# Patient Record
Sex: Female | Born: 1950 | Race: Black or African American | Hispanic: No | Marital: Married | Smoking: Current every day smoker
Health system: Southern US, Community
[De-identification: ages and names within clinical notes are randomized; demographics above are authoritative.]

## PROBLEM LIST (undated history)

## (undated) DIAGNOSIS — E785 Hyperlipidemia, unspecified: Secondary | ICD-10-CM

## (undated) DIAGNOSIS — E079 Disorder of thyroid, unspecified: Secondary | ICD-10-CM

## (undated) DIAGNOSIS — K219 Gastro-esophageal reflux disease without esophagitis: Secondary | ICD-10-CM

## (undated) DIAGNOSIS — G5600 Carpal tunnel syndrome, unspecified upper limb: Secondary | ICD-10-CM

## (undated) DIAGNOSIS — M069 Rheumatoid arthritis, unspecified: Secondary | ICD-10-CM

## (undated) DIAGNOSIS — F419 Anxiety disorder, unspecified: Secondary | ICD-10-CM

## (undated) DIAGNOSIS — Z9889 Other specified postprocedural states: Secondary | ICD-10-CM

## (undated) DIAGNOSIS — I10 Essential (primary) hypertension: Secondary | ICD-10-CM

## (undated) DIAGNOSIS — K635 Polyp of colon: Secondary | ICD-10-CM

## (undated) DIAGNOSIS — F329 Major depressive disorder, single episode, unspecified: Secondary | ICD-10-CM

## (undated) DIAGNOSIS — F32A Depression, unspecified: Secondary | ICD-10-CM

## (undated) DIAGNOSIS — K760 Fatty (change of) liver, not elsewhere classified: Secondary | ICD-10-CM

## (undated) DIAGNOSIS — I2510 Atherosclerotic heart disease of native coronary artery without angina pectoris: Secondary | ICD-10-CM

## (undated) HISTORY — PX: HEMORRHOID SURGERY: SHX153

## (undated) HISTORY — DX: Hyperlipidemia, unspecified: E78.5

## (undated) HISTORY — DX: Anxiety disorder, unspecified: F41.9

## (undated) HISTORY — DX: Polyp of colon: K63.5

## (undated) HISTORY — DX: Gastro-esophageal reflux disease without esophagitis: K21.9

## (undated) HISTORY — PX: SKIN BIOPSY: SHX1

## (undated) HISTORY — DX: Fatty (change of) liver, not elsewhere classified: K76.0

## (undated) HISTORY — PX: ABDOMINAL HYSTERECTOMY: SHX81

## (undated) HISTORY — DX: Other specified postprocedural states: Z98.890

## (undated) HISTORY — DX: Major depressive disorder, single episode, unspecified: F32.9

## (undated) HISTORY — PX: BACK SURGERY: SHX140

## (undated) HISTORY — DX: Depression, unspecified: F32.A

## (undated) HISTORY — DX: Carpal tunnel syndrome, unspecified upper limb: G56.00

---

## 1978-09-14 HISTORY — PX: HERNIA REPAIR: SHX51

## 1983-09-15 HISTORY — PX: HEMORRHOID SURGERY: SHX153

## 2005-09-14 DIAGNOSIS — Z9889 Other specified postprocedural states: Secondary | ICD-10-CM

## 2005-09-14 HISTORY — DX: Other specified postprocedural states: Z98.890

## 2005-09-14 HISTORY — PX: CARPAL TUNNEL RELEASE: SHX101

## 2010-07-16 ENCOUNTER — Ambulatory Visit: Payer: Self-pay | Admitting: Radiology

## 2010-07-16 ENCOUNTER — Emergency Department (HOSPITAL_BASED_OUTPATIENT_CLINIC_OR_DEPARTMENT_OTHER)
Admission: EM | Admit: 2010-07-16 | Discharge: 2010-07-16 | Payer: Self-pay | Source: Home / Self Care | Admitting: Emergency Medicine

## 2010-08-14 LAB — HM PAP SMEAR

## 2011-06-15 LAB — HM COLONOSCOPY

## 2012-02-28 ENCOUNTER — Emergency Department (HOSPITAL_BASED_OUTPATIENT_CLINIC_OR_DEPARTMENT_OTHER): Payer: 59

## 2012-02-28 ENCOUNTER — Encounter (HOSPITAL_BASED_OUTPATIENT_CLINIC_OR_DEPARTMENT_OTHER): Payer: Self-pay | Admitting: *Deleted

## 2012-02-28 ENCOUNTER — Emergency Department (HOSPITAL_BASED_OUTPATIENT_CLINIC_OR_DEPARTMENT_OTHER)
Admission: EM | Admit: 2012-02-28 | Discharge: 2012-02-28 | Disposition: A | Discharge status: Home / Self Care | Payer: 59 | Attending: Emergency Medicine | Admitting: Emergency Medicine

## 2012-02-28 DIAGNOSIS — J020 Streptococcal pharyngitis: Secondary | ICD-10-CM | POA: Insufficient documentation

## 2012-02-28 DIAGNOSIS — M069 Rheumatoid arthritis, unspecified: Secondary | ICD-10-CM | POA: Insufficient documentation

## 2012-02-28 DIAGNOSIS — I10 Essential (primary) hypertension: Secondary | ICD-10-CM | POA: Insufficient documentation

## 2012-02-28 DIAGNOSIS — E079 Disorder of thyroid, unspecified: Secondary | ICD-10-CM | POA: Insufficient documentation

## 2012-02-28 DIAGNOSIS — I2510 Atherosclerotic heart disease of native coronary artery without angina pectoris: Secondary | ICD-10-CM | POA: Insufficient documentation

## 2012-02-28 DIAGNOSIS — Z79899 Other long term (current) drug therapy: Secondary | ICD-10-CM | POA: Insufficient documentation

## 2012-02-28 DIAGNOSIS — Z87891 Personal history of nicotine dependence: Secondary | ICD-10-CM | POA: Insufficient documentation

## 2012-02-28 DIAGNOSIS — E119 Type 2 diabetes mellitus without complications: Secondary | ICD-10-CM | POA: Insufficient documentation

## 2012-02-28 HISTORY — DX: Rheumatoid arthritis, unspecified: M06.9

## 2012-02-28 HISTORY — DX: Essential (primary) hypertension: I10

## 2012-02-28 HISTORY — DX: Disorder of thyroid, unspecified: E07.9

## 2012-02-28 HISTORY — DX: Atherosclerotic heart disease of native coronary artery without angina pectoris: I25.10

## 2012-02-28 MED ORDER — AMOXICILLIN 500 MG PO CAPS
500.0000 mg | ORAL_CAPSULE | Freq: Once | ORAL | Status: AC
  Administered 2012-02-28: 500 mg via ORAL

## 2012-02-28 MED ORDER — AMOXICILLIN 500 MG PO CAPS: 500.0000 mg | ORAL_CAPSULE | Freq: Two times a day (BID) | ORAL | Status: AC

## 2012-02-28 MED ORDER — IBUPROFEN 400 MG PO TABS
600.0000 mg | ORAL_TABLET | Freq: Once | ORAL | Status: AC
  Administered 2012-02-28: 600 mg via ORAL
  Filled 2012-02-28: qty 1

## 2012-02-28 MED ORDER — AMOXICILLIN 500 MG PO CAPS
ORAL_CAPSULE | ORAL | Status: AC
  Administered 2012-02-28: 500 mg via ORAL
  Filled 2012-02-28: qty 1

## 2012-02-28 MED ORDER — PENICILLIN G BENZATHINE 1200000 UNIT/2ML IM SUSP
1.2000 10*6.[IU] | Freq: Once | INTRAMUSCULAR | Status: DC
  Filled 2012-02-28: qty 2

## 2012-02-28 NOTE — Discharge Instructions (Signed)
You have strep throat. Treatment is antibiotics. You may be contagious for 24 hours after antibiotic. This seems to have made your rheumatoid arthritis worse.  You can take voltaren 75 mg twice daily for short term-next 2-4 days. You should follow up your joint pains with your primary doctor and rheumatoid doctor. If you have vomiting, shortness of breath, fevers persist or other concerning symptoms then return to the emergency room or call you doctor.  Strep Throat Strep throat is an infection of the throat. It is caused by a germ. Strep throat spreads from person to person by coughing, sneezing, or close contact. HOME CARE  Rinse your mouth (gargle) with warm salt water (1 teaspoon salt in 1 cup of water). Do this 3 to 4 times per day or as needed for comfort.   Family members with a sore throat or fever should see a doctor.   Make sure everyone in your house washes their hands well.   Do not share food, drinking cups, or personal items.   Eat soft foods until your sore throat gets better.   Drink enough water and fluids to keep your pee (urine) clear or pale yellow.   Rest.   Stay home from school, daycare, or work until you have taken medicine for 24 hours.   Only take medicine as told by your doctor.   Take your medicine as told. Finish it even if you start to feel better.  GET HELP RIGHT AWAY IF:   You have new problems, such as throwing up (vomiting) or bad headaches.   You have a stiff or painful neck, chest pain, trouble breathing, or trouble swallowing.   You have very bad throat pain, drooling, or changes in your voice.   Your neck puffs up (swells) or gets red and tender.   You have a fever.   You are very tired, your mouth is dry, or you are peeing less than normal.   You cannot wake up completely.   You get a rash, cough, or earache.   You have green, yellow-brown, or bloody spit.   Your pain does not get better with medicine.  MAKE SURE YOU:    Understand these instructions.   Will watch your condition.   Will get help right away if you are not doing well or get worse.  Document Released: 02/17/2008 Document Revised: 08/20/2011 Document Reviewed: 10/30/2010 [facility] Patient Information 2012 [place], [place].

## 2012-02-28 NOTE — ED Notes (Signed)
Pt has hx of RA and her body pain comes and goes. This episode since Friday. ST, Chills onset yesterday

## 2012-02-28 NOTE — ED Provider Notes (Signed)
I saw and evaluated the patient, reviewed the resident's note and I agree with the findings and plan.   .Face to face Exam:  General:  Awake HEENT:  Atraumatic Resp:  Normal effort Abd:  Nondistended Neuro:No focal weakness Lymph: No adenopathy   Nelia Shi, MD 02/28/12 2138

## 2012-02-28 NOTE — ED Provider Notes (Signed)
History     CSN: [id number]  Arrival date & time 02/28/12  1918   First MD Initiated Contact with Patient 02/28/12 1954      Chief Complaint  Patient presents with  . Sore Throat    (Consider location/radiation/quality/duration/timing/severity/associated sxs/prior treatment) HPI Comments: 61 yo female with RA, DM presents with worsening of usual aches and pains, and new onset sore throat for 2 days, fever and chills at home. Has taken only her plaquenil today for pain. Her pains are usually in her right hip, low back and left elbow. Has odynophagia and not eating solids since yesterday. She denies any dyspnea, cough, chest pain, congestion, runny nose, rash, leg swelling, abdominal pain, emesis, poor appetite, change in urine.   Patient is a 61 y.o. female presenting with pharyngitis.  Sore Throat Associated symptoms include chills. Pertinent negatives include no chest pain, congestion, coughing, diaphoresis, headaches, joint swelling, neck pain or rash.    Past Medical History  Diagnosis Date  . Rheumatoid arthritis   . Hypertension   . Diabetes mellitus   . Thyroid disease   . Coronary artery disease     Past Surgical History  Procedure Date  . Abdominal hysterectomy   . Hemorrhoid surgery   . Skin biopsy   . Back surgery     No family history on file.  History  Substance Use Topics  . Smoking status: Former Games developer  . Smokeless tobacco: Not on file  . Alcohol Use: Yes    OB History    Grav Para Term Preterm Abortions TAB SAB Ect Mult Living                  Review of Systems  Constitutional: Positive for chills. Negative for diaphoresis.  HENT: Negative for ear pain, nosebleeds, congestion, rhinorrhea, neck pain and postnasal drip.   Respiratory: Negative for cough, choking, shortness of breath and wheezing.   Cardiovascular: Negative for chest pain and leg swelling.  Genitourinary: Negative for decreased urine volume and difficulty urinating.    Musculoskeletal: Negative for joint swelling.  Skin: Negative for rash.  Neurological: Positive for dizziness. Negative for headaches.  All other systems reviewed and are negative.    Allergies  Review of patient's allergies indicates no known allergies.  Home Medications   Current Outpatient Rx  Name Route Sig Dispense Refill  . ALPRAZOLAM 1 MG PO TABS Oral Take 0.5-1 mg by mouth 2 (two) times daily as needed. For anxiety or sleep    . AMLODIPINE BESYLATE 10 MG PO TABS Oral Take 10 mg by mouth daily.    Marland Kitchen DICLOFENAC SODIUM 75 MG PO TBEC Oral Take 75 mg by mouth at bedtime.    Marland Kitchen EZETIMIBE 10 MG PO TABS Oral Take 10 mg by mouth daily.    Marland Kitchen HYDROXYCHLOROQUINE SULFATE 200 MG PO TABS Oral Take 200 mg by mouth 2 (two) times daily.    Marland Kitchen LEVOTHYROXINE SODIUM PO Oral Take by mouth.    Marland Kitchen LIDOCAINE 5 % EX PTCH Transdermal Place 1 patch onto the skin daily. Remove & Discard patch within 12 hours or as directed by MD    . METFORMIN HCL ER 500 MG PO TB24 Oral Take 1,500 mg by mouth at bedtime.    Marland Kitchen NAPROXEN SODIUM 220 MG PO TABS Oral Take 440 mg by mouth 2 (two) times daily as needed. For pain    . PANTOPRAZOLE SODIUM 40 MG PO TBEC Oral Take 40 mg by mouth 2 (two) times daily.     Marland Kitchen  PSEUDOEPHEDRINE-GUAIFENESIN ER 60-600 MG PO TB12 Oral Take 1 tablet by mouth every 12 (twelve) hours. For cough    . VALSARTAN-HYDROCHLOROTHIAZIDE 320-25 MG PO TABS Oral Take 1 tablet by mouth daily.      BP 160/89  Pulse 92  Temp 99.7 F (37.6 C) (Oral)  Resp 20  Ht 5\' 5"  (1.651 m)  Wt 194 lb (87.998 kg)  BMI 32.28 kg/m2  SpO2 98%  Physical Exam  Vitals reviewed. Constitutional: She is oriented to person, place, and time. She appears well-developed and well-nourished. No distress.  HENT:  Head: Normocephalic and atraumatic.  Right Ear: External ear normal.  Left Ear: External ear normal.       Moderate erythema posterior pharynx. No exudates.  Bilateral cervical LAD.  Eyes: EOM are normal. Pupils are  equal, round, and reactive to light.  Neck: Neck supple.  Cardiovascular: Normal rate, regular rhythm and normal heart sounds.   Pulmonary/Chest: Effort normal and breath sounds normal. No respiratory distress. She has no wheezes. She has no rales.  Abdominal: Soft.  Musculoskeletal: She exhibits no edema and no tenderness.  Lymphadenopathy:    She has cervical adenopathy.  Neurological: She is alert and oriented to person, place, and time. No cranial nerve deficit. Coordination normal.  Skin: No rash noted. She is not diaphoretic.  Psychiatric: She has a normal mood and affect.    ED Course  Procedures (including critical care time)  Labs Reviewed  RAPID STREP SCREEN - Abnormal; Notable for the following:    Streptococcus, Group A Screen (Direct) POSITIVE (*)     All other components within normal limits   No results found.   1. Strep pharyngitis   2. Rheumatoid arthritis       MDM  61 yo female with RA presents with worsening of baseline joint pains, 2 days of sore throat, low grade fever, positive rapid strep. Will treat with pcn IM x1 and advise to use her home NSAID scheduled for next several days. Patient to see her rheumatologist this month for follow up.  Follow up for worsening fevers, dyspnea, joint swelling.       Durwin Reges, MD 02/28/12 2128

## 2012-11-10 ENCOUNTER — Encounter: Payer: Self-pay | Admitting: Family Medicine

## 2012-11-10 DIAGNOSIS — F32A Depression, unspecified: Secondary | ICD-10-CM | POA: Insufficient documentation

## 2012-11-10 DIAGNOSIS — F419 Anxiety disorder, unspecified: Secondary | ICD-10-CM | POA: Insufficient documentation

## 2012-11-10 DIAGNOSIS — E785 Hyperlipidemia, unspecified: Secondary | ICD-10-CM | POA: Insufficient documentation

## 2012-11-10 DIAGNOSIS — K635 Polyp of colon: Secondary | ICD-10-CM | POA: Insufficient documentation

## 2012-11-10 DIAGNOSIS — K219 Gastro-esophageal reflux disease without esophagitis: Secondary | ICD-10-CM | POA: Insufficient documentation

## 2012-11-10 DIAGNOSIS — K760 Fatty (change of) liver, not elsewhere classified: Secondary | ICD-10-CM | POA: Insufficient documentation

## 2012-12-09 ENCOUNTER — Other Ambulatory Visit: Payer: Self-pay | Admitting: Family Medicine

## 2012-12-09 ENCOUNTER — Other Ambulatory Visit (INDEPENDENT_AMBULATORY_CARE_PROVIDER_SITE_OTHER): Payer: 59 | Admitting: Family Medicine

## 2012-12-09 DIAGNOSIS — R5381 Other malaise: Secondary | ICD-10-CM

## 2012-12-09 DIAGNOSIS — E782 Mixed hyperlipidemia: Secondary | ICD-10-CM

## 2012-12-09 DIAGNOSIS — M79609 Pain in unspecified limb: Secondary | ICD-10-CM

## 2012-12-09 DIAGNOSIS — R7301 Impaired fasting glucose: Secondary | ICD-10-CM

## 2012-12-09 DIAGNOSIS — E559 Vitamin D deficiency, unspecified: Secondary | ICD-10-CM

## 2012-12-09 DIAGNOSIS — I10 Essential (primary) hypertension: Secondary | ICD-10-CM

## 2012-12-10 LAB — CBC WITH DIFFERENTIAL/PLATELET
Basophils Absolute: 0 10*3/uL (ref 0.0–0.1)
Basophils Relative: 0 % (ref 0–1)
Eosinophils Absolute: 0.1 10*3/uL (ref 0.0–0.7)
Eosinophils Relative: 1 % (ref 0–5)
HCT: 38.7 % (ref 36.0–46.0)
Hemoglobin: 13 g/dL (ref 12.0–15.0)
Lymphocytes Relative: 42 % (ref 12–46)
Lymphs Abs: 3.9 10*3/uL (ref 0.7–4.0)
MCH: 28.2 pg (ref 26.0–34.0)
MCHC: 33.6 g/dL (ref 30.0–36.0)
MCV: 83.9 fL (ref 78.0–100.0)
Monocytes Absolute: 0.5 10*3/uL (ref 0.1–1.0)
Monocytes Relative: 6 % (ref 3–12)
Neutro Abs: 4.7 10*3/uL (ref 1.7–7.7)
Neutrophils Relative %: 51 % (ref 43–77)
Platelets: 309 10*3/uL (ref 150–400)
RBC: 4.61 MIL/uL (ref 3.87–5.11)
RDW: 14.5 % (ref 11.5–15.5)
WBC: 9.3 10*3/uL (ref 4.0–10.5)

## 2012-12-10 LAB — LIPID PANEL
Cholesterol: 231 mg/dL — ABNORMAL HIGH (ref 0–200)
HDL: 50 mg/dL (ref >39)
LDL Cholesterol: 160 mg/dL — ABNORMAL HIGH (ref 0–99)
Total CHOL/HDL Ratio: 4.6 Ratio
Triglycerides: 103 mg/dL (ref <150)
VLDL: 21 mg/dL (ref 0–40)

## 2012-12-10 LAB — COMPLETE METABOLIC PANEL WITH GFR
ALT: 43 U/L — ABNORMAL HIGH (ref 0–35)
AST: 29 U/L (ref 0–37)
Albumin: 4.4 g/dL (ref 3.5–5.2)
Alkaline Phosphatase: 79 U/L (ref 39–117)
BUN: 20 mg/dL (ref 6–23)
CO2: 27 mEq/L (ref 19–32)
Calcium: 10 mg/dL (ref 8.4–10.5)
Chloride: 101 mEq/L (ref 96–112)
Creat: 0.99 mg/dL (ref 0.50–1.10)
GFR, Est African American: 71 mL/min
GFR, Est Non African American: 62 mL/min
Glucose, Bld: 90 mg/dL (ref 70–99)
Potassium: 3.6 mEq/L (ref 3.5–5.3)
Sodium: 138 mEq/L (ref 135–145)
Total Bilirubin: 0.5 mg/dL (ref 0.3–1.2)
Total Protein: 7.3 g/dL (ref 6.0–8.3)

## 2012-12-10 LAB — VITAMIN D 25 HYDROXY (VIT D DEFICIENCY, FRACTURES): Vit D, 25-Hydroxy: 15 ng/mL — ABNORMAL LOW (ref 30–89)

## 2012-12-10 LAB — HEMOGLOBIN A1C
Hgb A1c MFr Bld: 6.3 % — ABNORMAL HIGH (ref <5.7)
Mean Plasma Glucose: 134 mg/dL — ABNORMAL HIGH (ref <117)

## 2012-12-10 LAB — URIC ACID: Uric Acid, Serum: 6.2 mg/dL (ref 2.4–7.0)

## 2012-12-12 LAB — CK: Total CK: 318 U/L — ABNORMAL HIGH (ref 7–177)

## 2012-12-15 ENCOUNTER — Ambulatory Visit (INDEPENDENT_AMBULATORY_CARE_PROVIDER_SITE_OTHER): Payer: 59 | Admitting: Family Medicine

## 2012-12-15 ENCOUNTER — Encounter: Payer: Self-pay | Admitting: Family Medicine

## 2012-12-15 VITALS — BP 147/83 | HR 79 | Wt 204.0 lb

## 2012-12-15 DIAGNOSIS — R748 Abnormal levels of other serum enzymes: Secondary | ICD-10-CM

## 2012-12-15 DIAGNOSIS — E669 Obesity, unspecified: Secondary | ICD-10-CM

## 2012-12-15 DIAGNOSIS — I10 Essential (primary) hypertension: Secondary | ICD-10-CM | POA: Insufficient documentation

## 2012-12-15 DIAGNOSIS — R7401 Elevation of levels of liver transaminase levels: Secondary | ICD-10-CM

## 2012-12-15 DIAGNOSIS — E785 Hyperlipidemia, unspecified: Secondary | ICD-10-CM

## 2012-12-15 DIAGNOSIS — IMO0001 Reserved for inherently not codable concepts without codable children: Secondary | ICD-10-CM

## 2012-12-15 DIAGNOSIS — M109 Gout, unspecified: Secondary | ICD-10-CM

## 2012-12-15 DIAGNOSIS — E119 Type 2 diabetes mellitus without complications: Secondary | ICD-10-CM

## 2012-12-15 DIAGNOSIS — Z01818 Encounter for other preprocedural examination: Secondary | ICD-10-CM

## 2012-12-15 MED ORDER — CANAGLIFLOZIN 300 MG PO TABS: 300.0000 mg | ORAL_TABLET | Freq: Every day | ORAL | Status: AC

## 2012-12-15 MED ORDER — ALLOPURINOL 300 MG PO TABS: 300.0000 mg | ORAL_TABLET | Freq: Every day | ORAL | Status: AC

## 2012-12-15 MED ORDER — DULOXETINE HCL 60 MG PO CPEP: 60.0000 mg | ORAL_CAPSULE | Freq: Every day | ORAL | Status: AC

## 2012-12-15 MED ORDER — ROSUVASTATIN CALCIUM 10 MG PO TABS: 10.0000 mg | ORAL_TABLET | Freq: Every day | ORAL | Status: AC

## 2012-12-15 NOTE — Patient Instructions (Addendum)
1)  Gout - Your uric acid level is down to 6.2 with 200 mg of Allopurinol.  Let's increase your dosage to 300 mg to get your level to <6.  You will need to go back on the Colcrys 1 per day for the next 2 weeks as you increase to 300 mg.  After you have been on the 300 mg for 2 weeks then stop the Colcrys.    2)  Cholesterol/CAD - Start on Crestor 5 mg per day (1/2 of the 10).  You also need to be on a Baby ASA - 81 mg after your surgery.  We are going to recheck your cholesterol and muscle enzyme in 3 months.  If ... you develop substantial increased muscle pain, come in before so we can just check the muscle enz  3)  Diabetes - Stay on the Metformin 3 per day and add some Invokana.  You will start with 100 mg for 2 weeks and then increase to 300 mg.  We'll recheck your A1c in 3 months.  4)  Vitamin D - You might feel a lot better if you could get your Vitamin D level to 55.  You need to add Vitamin D 3 - 5,000 - 10,000 per day.  We'll recheck this number in 3 months.    5)  Elevated Liver Enzymes - Your ALT continues to be mildly elevated.  If you would do a low carb diet like [place], Sugar Busters or HCG and take Vitamin E 400 IU daily then I am sure your level enzymes would be normal and you would have less of a fatty liver.      Fatty Liver Fatty liver is the accumulation of fat in liver cells. It is also called hepatosteatosis or steatohepatitis. It is normal for your liver to contain some fat. If fat is more than 5 to 10% of your liver's weight, you have fatty liver.  There are often no symptoms (problems) for years while damage is still occurring. People often learn about their fatty liver when they have medical tests for other reasons. Fat can damage your liver for years or even decades without causing problems. When it becomes severe, it can cause fatigue, weight loss, weakness, and confusion. This makes you more likely to develop more serious liver problems. The liver is the largest  organ in the body. It does a lot of work and often gives no warning signs when it is sick until late in a disease. The liver has many important jobs including:  Breaking down foods.  Storing vitamins, iron, and other minerals.  Making proteins.  Making bile for food digestion.  Breaking down many products including medications, alcohol and some poisons. CAUSES  There are a number of different conditions, medications, and poisons that can cause a fatty liver. Eating too many calories causes fat to build up in the liver. Not processing and breaking fats down normally may also cause this. Certain conditions, such as obesity, diabetes, and high triglycerides also cause this. Most fatty liver patients tend to be middle-aged and over weight.  Some causes of fatty liver are:  Alcohol over consumption.  Malnutrition.  Steroid use.  Valproic acid toxicity.  Obesity.  Cushing's syndrome.  Poisons.  Tetracycline in high dosages.  Pregnancy.  Diabetes.  Hyperlipidemia.  Rapid weight loss. Some people develop fatty liver even having none of these conditions. SYMPTOMS  Fatty liver most often causes no problems. This is called asymptomatic.  It can be  diagnosed with blood tests and also by a liver biopsy.  It is one of the most common causes of minor elevations of liver enzymes on routine blood tests.  Specialized Imaging of the liver using ultrasound, CT (computed tomography) scan, or MRI (magnetic resonance imaging) can suggest a fatty liver but a biopsy is needed to confirm it.  A biopsy involves taking a small sample of liver tissue. This is done by using a needle. It is then looked at under a microscope by a specialist. TREATMENT  It is important to treat the cause. Simple fatty liver without a medical reason may not need treatment.  Weight loss, fat restriction, and exercise in overweight patients produces inconsistent results but is worth trying.  Fatty liver due to  alcohol toxicity may not improve even with stopping drinking.  Good control of diabetes may reduce fatty liver.  Lower your triglycerides through diet, medication or both.  Eat a balanced, healthy diet.  Increase your physical activity.  Get regular checkups from a liver specialist.  There are no medical or surgical treatments for a fatty liver or NASH, but improving your diet and increasing your exercise may help prevent or reverse some of the damage. PROGNOSIS  Fatty liver may cause no damage or it can lead to an inflammation of the liver. This is, called steatohepatitis. When it is linked to alcohol abuse, it is called alcoholic steatohepatitis. It often is not linked to alcohol. It is then called nonalcoholic steatohepatitis, or NASH. Over time the liver may become scarred and hardened. This condition is called cirrhosis. Cirrhosis is serious and may lead to liver failure or cancer. NASH is one of the leading causes of cirrhosis. About 10-20% of Americans have fatty liver and a smaller 2-5% has NASH. Document Released: 10/16/2005 Document Revised: 11/23/2011 Document Reviewed: 12/09/2005 [facility] Patient Information 2013 [place], [place].

## 2012-12-15 NOTE — Progress Notes (Addendum)
Subjective:     Patient ID: Brianna Long, female   DOB: April 08, 1951, 62 y.o.   MRN: [id number]  HPI Brianna Long is here today to go over her most recent lab results and to discuss the difficulty she has had getting cleared for back surgery.  She was seen by Dr. Tollie Pizza with [facility].  She was under the impression that he cleared her for surgery so she does not understand why she has not been able to proceed with her surgery.  She also needs to have several of her medications refilled.    1)  Hyperlipidemia - Dr. Reuel Boom is concerned that she is not on a statin. She was on one back in 2012 but it was stopped because of her pain and because her CPK was elevated. She has been taking a combination of Lovaza and Zetia.    2)  Gout - She has been taking 100 mg of Allopurinol.  3)  Blood Pressure - She is taking her Diovan HCT in the am and Norvasc at night.    4)  Mood/Chronic Pain - She is doing fine on the 30 mg of Cymbalta.    5)  Diabetes - She is doing fine on 1000 mg of Metformin daily.      Review of Systems  Constitutional: Positive for activity change (She is limited by pain ) and fatigue. Negative for unexpected weight change.  Respiratory: Negative for shortness of breath.   Cardiovascular: Negative for chest pain, palpitations and leg swelling.  Gastrointestinal: Negative for diarrhea.  Endocrine: Negative for polydipsia and polyuria.  Genitourinary: Negative for urgency and frequency.  Musculoskeletal: Positive for myalgias, back pain, joint swelling and arthralgias.  Skin: Negative for rash.  Neurological: Positive for weakness and numbness.  Psychiatric/Behavioral: Positive for agitation (She is annoyed that she is having to jump through so many hoops to get her surgery.).       Objective:   Physical Exam  Constitutional: She is oriented to person, place, and time. She appears well-nourished. No distress.  HENT:  Head: Normocephalic.  Eyes: Conjunctivae and  EOM are normal. No scleral icterus.  Neck: Neck supple. No thyromegaly present.  Cardiovascular: Normal rate, regular rhythm and normal heart sounds.   Pulmonary/Chest: Effort normal and breath sounds normal.  Abdominal: Soft. She exhibits no distension and no mass. There is no tenderness.  Musculoskeletal: She exhibits tenderness.  Neurological: She is alert and oriented to person, place, and time.  Skin: Skin is warm and dry. No rash noted.  Psychiatric: She has a normal mood and affect. Her behavior is normal. Judgment and thought content normal.  She is frustrated and wants to feel better.         Assessment:     1)  Hyperlipidemia - Her LDL is elevated at 160.  Her cardiologist is concerned about her not being on a statin given her history of CAD.  She was on Lipitor until 4/12.  At that time, she was complaining of a lot of generalized muscle pain.  A CPK level was checked and it was noted to be >400 so the Lipitor was stopped and she was put on Zeta.  She was on Zetia for a while but then stopped taking it because of the cost.    2)  Gout - Her uric level has improved.    3)  Hypertension - Her BP was elevated today.  She used to take Exforge HCT and her BP was controlled. She is  currently taking Diovan HCT in the am and Norvasc at night.   4)  Chronic Pain - She is taking 60 mg of Cymbalta.    5)  Type II DM - Her A1c is 6.3%.    6)  Elevated Transaminase - Her ALT remains slightly elevated. She has been told in the past she has a fatty liver.    7)  Vitamin D Deficiency - Her Vitamin D is very low.     8)  Obesity   9)  Surgical Clearance -  Dr. Rosann Auerbach preoperative assessment was that she is at low risk for having a  perioperative cardiac event and he did not feel that she needed a stress test prior to surgery. The note did however go on to state that given her history of CAD, she is at a "high risk for future CAD given her risk factors.        Plan:     1)   Jeannetta's Lipitor was stopped back in 2012 because of her pain and the elevation of CPK. We rechecked her level last week and while it is 100 points lower than in 2012 on Lipitor, it is still elevated.  Since there is concern of her having surgery off of a statin, we'll get her on a low dosage of Crestor.  She is return for a recheck of her CPK if she develops increased pain.  She has not been taking Zetia due to her cost.  We'll recheck her lipid panel in 3 months.    2)  She is to get back on Colcrys and increase her allopurinol to 300 mg then stop the Colcrys after 2 weeks.    3) Recommended that she take her Diovan HCT and Norasc together in the am.    4)  Refilled her Cymbalta.    5)  Adding some Invokana to help get her A1c <6 and help with weight loss.   6)  She was encouraged to follow a low carb diet like [place] vs Sugar Busters vs the HCG diet to help both her pain and her fatty liver. She is to take some Vitamin E.    7)  We have discussed before the importance of Vitamin D and that it might help her pain.  She is to take 5,000 - 10,000 IU of Vitamin D3.    8)  Hopefully, she will be able to exercise once her back feels better.

## 2012-12-20 ENCOUNTER — Ambulatory Visit: Payer: 59 | Admitting: Family Medicine

## 2012-12-26 ENCOUNTER — Ambulatory Visit: Payer: 59 | Admitting: Family Medicine

## 2012-12-27 ENCOUNTER — Encounter: Payer: Self-pay | Admitting: Family Medicine

## 2013-04-14 ENCOUNTER — Encounter: Payer: Self-pay | Admitting: *Deleted

## 2013-09-02 ENCOUNTER — Other Ambulatory Visit: Payer: Self-pay | Admitting: Family Medicine

## 2013-12-01 ENCOUNTER — Other Ambulatory Visit: Payer: Self-pay | Admitting: Family Medicine

## 2016-05-24 ENCOUNTER — Emergency Department (HOSPITAL_BASED_OUTPATIENT_CLINIC_OR_DEPARTMENT_OTHER)
Admission: EM | Admit: 2016-05-24 | Discharge: 2016-05-25 | Disposition: A | Discharge status: Home / Self Care | Payer: Medicare Other | Attending: Emergency Medicine | Admitting: Emergency Medicine

## 2016-05-24 ENCOUNTER — Encounter (HOSPITAL_BASED_OUTPATIENT_CLINIC_OR_DEPARTMENT_OTHER): Payer: Self-pay | Admitting: Emergency Medicine

## 2016-05-24 DIAGNOSIS — Z7984 Long term (current) use of oral hypoglycemic drugs: Secondary | ICD-10-CM | POA: Insufficient documentation

## 2016-05-24 DIAGNOSIS — F17210 Nicotine dependence, cigarettes, uncomplicated: Secondary | ICD-10-CM | POA: Diagnosis not present

## 2016-05-24 DIAGNOSIS — I2510 Atherosclerotic heart disease of native coronary artery without angina pectoris: Secondary | ICD-10-CM | POA: Insufficient documentation

## 2016-05-24 DIAGNOSIS — R51 Headache: Secondary | ICD-10-CM | POA: Insufficient documentation

## 2016-05-24 DIAGNOSIS — E119 Type 2 diabetes mellitus without complications: Secondary | ICD-10-CM | POA: Diagnosis not present

## 2016-05-24 DIAGNOSIS — I10 Essential (primary) hypertension: Secondary | ICD-10-CM | POA: Diagnosis not present

## 2016-05-24 DIAGNOSIS — R519 Headache, unspecified: Secondary | ICD-10-CM

## 2016-05-24 DIAGNOSIS — Z79899 Other long term (current) drug therapy: Secondary | ICD-10-CM | POA: Insufficient documentation

## 2016-05-24 DIAGNOSIS — H5713 Ocular pain, bilateral: Secondary | ICD-10-CM | POA: Diagnosis present

## 2016-05-24 MED ORDER — KETOROLAC TROMETHAMINE 15 MG/ML IJ SOLN
15.0000 mg | Freq: Once | INTRAMUSCULAR | Status: AC
  Administered 2016-05-24: 15 mg via INTRAVENOUS
  Filled 2016-05-24: qty 1

## 2016-05-24 NOTE — ED Provider Notes (Addendum)
[facility] Provider Note: Brianna DellJ. Lane Braedin Millhouse, MD, FACEP  CSN: [id number] MRN: [id number] ARRIVAL: 05/24/16 at 2138 By signing my name below, I, Brianna Long, attest that this documentation has been prepared under the direction and in the presence of Brianna LibraJohn Loraina Stauffer, MD.  Electronically Signed: Octavia HeirArianna Long, ED Scribe. 05/24/16. 11:30 PM.   CHIEF COMPLAINT  Eye Pain (bilateral)   HISTORY OF PRESENT ILLNESS  Brianna Long is a 65 y.o. female who presents to the Emergency Department with bilateral eye and forehead pain onset this morning. Pt reports she woke up this morning with bilateral eye redness and says it feels like a pressure-like feeling in her eyes. She notes having an associated sharp headache in the forehead and mild nausea. She has taken tramadol and mucinex to alleviate her symptoms with partial relief. She has not taken Tylenol or NSAID. Pt was seen by her opthalmologist last week where she was diagnosed with dry eyes and was started on Restasis and a steroid. She has run out of her Restasis samples but has a prescription she has yet to fill. Pt denies visual disturbance or photophobia. She has had some nasal congestion.  Past Medical History:  Diagnosis Date  . Anxiety   . Carpal tunnel syndrome   . Colon polyps   . Coronary artery disease   . Coronary artery disease    Dr Jeralene HuffKurt Daniels  . Depression   . Diabetes mellitus   . Fatty liver   . GERD (gastroesophageal reflux disease)   . H/O carpal tunnel repair 2007  . Hx of biopsy    Left Breast  . Hyperlipidemia   . Hypertension   . Hypertension   . Rheumatoid arthritis(714.0)   . Thyroid disease     Past Surgical History:  Procedure Laterality Date  . ABDOMINAL HYSTERECTOMY    . BACK SURGERY    . CARPAL TUNNEL RELEASE  2007  . HEMORRHOID SURGERY    . HEMORRHOID SURGERY  1985  . HERNIA REPAIR  1980  . SKIN BIOPSY      Family History  Problem Relation Age of Onset  . Hypertension Mother   .  Hyperlipidemia Mother   . Depression Mother   . Thyroid disease Mother   . Hypertension Father   . Breast cancer Paternal Aunt     Social History  Substance Use Topics  . Smoking status: Current Every Day Smoker    Packs/day: 0.50    Years: 0.00    Types: Cigarettes    Last attempt to quit: 07/29/2009  . Smokeless tobacco: Never Used  . Alcohol use No    Prior to Admission medications   Medication Sig Start Date End Date Taking? Authorizing Provider  TRAMADOL HCL ER PO Take by mouth.   Yes Historical Provider, MD  allopurinol (ZYLOPRIM) 100 MG tablet Take 100 mg by mouth 2 (two) times daily.    Historical Provider, MD  allopurinol (ZYLOPRIM) 300 MG tablet Take 1 tablet (300 mg total) by mouth daily. 12/15/12 12/15/13  Gillian Scarceobyn K Zanard, MD  ALPRAZolam Prudy Feeler(XANAX) 1 MG tablet Take 0.5-1 mg by mouth 2 (two) times daily as needed. For anxiety or sleep    Historical Provider, MD  amLODipine (NORVASC) 10 MG tablet Take 10 mg by mouth daily.    Historical Provider, MD  Canagliflozin (INVOKANA) 300 MG TABS Take 300 mg by mouth daily. 12/15/12   Gillian Scarceobyn K Zanard, MD  COLCRYS 0.6 MG tablet  11/13/12   Historical Provider, MD  DULoxetine (CYMBALTA) 60 MG capsule Take 1 capsule (60 mg total) by mouth daily. 12/15/12   Gillian Scarce, MD  levothyroxine (SYNTHROID, LEVOTHROID) 100 MCG tablet Take 100 mcg by mouth daily.    Historical Provider, MD  metFORMIN (GLUCOPHAGE-XR) 500 MG 24 hr tablet Take 1,000 mg by mouth at bedtime.     Historical Provider, MD  omega-3 acid ethyl esters (LOVAZA) 1 G capsule Take 2 g by mouth 2 (two) times daily.    Historical Provider, MD  pantoprazole (PROTONIX) 40 MG tablet Take 40 mg by mouth 2 (two) times daily.     Historical Provider, MD  promethazine (PHENERGAN) 25 MG tablet Take 12.5 mg by mouth every 6 (six) hours as needed for nausea.    Historical Provider, MD  rosuvastatin (CRESTOR) 10 MG tablet Take 1 tablet (10 mg total) by mouth daily. 12/15/12   Gillian Scarce, MD    valsartan-hydrochlorothiazide (DIOVAN-HCT) 320-25 MG per tablet Take 1 tablet by mouth daily.    Historical Provider, MD    Allergies Review of patient's allergies indicates no known allergies.   REVIEW OF SYSTEMS  Negative except as noted here or in the History of Present Illness.   PHYSICAL EXAMINATION  Initial Vital Signs Blood pressure 159/73, pulse (!) 54, temperature 98.2 F (36.8 C), temperature source Oral, resp. rate 18, height 5\' 5"  (1.651 m), weight 192 lb (87.1 kg), SpO2 99 %.  Examination General: Well-developed, well-nourished female in no acute distress; appearance consistent with age of record HENT: normocephalic; atraumatic; mild tenderness to percussion over frontal sinuses Eyes: pupils equal, round and reactive to light; extraocular muscles intact, mild conjunctival injection bilaterally Neck: supple Heart: regular rate and rhythm Lungs: clear to auscultation bilaterally Abdomen: soft; nondistended; nontender; no masses or hepatosplenomegaly; bowel sounds present Extremities: No deformity; full range of motion; pulses normal Neurologic: Awake, alert and oriented; motor function intact in all extremities and symmetric; no facial droop Skin: Warm and dry Psychiatric: Normal mood and affect   RESULTS  Summary of this visit's results, reviewed by myself:   EKG Interpretation  Date/Time:    Ventricular Rate:    PR Interval:    QRS Duration:   QT Interval:    QTC Calculation:   R Axis:     Text Interpretation:        Laboratory Studies: No results found for this or any previous visit (from the past 24 hour(s)). Imaging Studies: No results found.  ED COURSE  Nursing notes and initial vitals signs, including pulse oximetry, reviewed.  1:00 AM Patient feeling significantly better after IV Toradol. She plans on calling her ophthalmologist Dr. Arville Lime later today.  PROCEDURES    ED DIAGNOSES     ICD-9-CM ICD-10-CM   1. Headache around the eyes  784.0 R51     I personally performed the services described in this documentation, which was scribed in my presence. The recorded information has been reviewed and is accurate.    Brianna Libra, MD 05/25/16 0100    Brianna Libra, MD 05/25/16 1610

## 2016-05-24 NOTE — ED Triage Notes (Addendum)
Patient reports this morning she woke up and both of her eyes were "blood shot".  Reports she also has a headache with this and she has pain in bilateral eyes.  States that this occurs anytime she opens her eyes.  Denies vision changes/deficits.  Patient reports she saw opthalmologist last week and was using Restasis.  States she ran out about 4 days ago and hasn't used this since.  Reports she is using a steroid as well.

## 2016-05-25 NOTE — ED Notes (Signed)
Pt given d/c instructions as per chart. Verbalizes understanding. No questions. 

## 2021-12-16 ENCOUNTER — Emergency Department (HOSPITAL_BASED_OUTPATIENT_CLINIC_OR_DEPARTMENT_OTHER): Payer: Medicare Other

## 2021-12-16 ENCOUNTER — Encounter (HOSPITAL_BASED_OUTPATIENT_CLINIC_OR_DEPARTMENT_OTHER): Payer: Self-pay | Admitting: Emergency Medicine

## 2021-12-16 ENCOUNTER — Other Ambulatory Visit: Payer: Self-pay

## 2021-12-16 ENCOUNTER — Emergency Department (HOSPITAL_BASED_OUTPATIENT_CLINIC_OR_DEPARTMENT_OTHER)
Admission: EM | Admit: 2021-12-16 | Discharge: 2021-12-16 | Disposition: A | Discharge status: Home / Self Care | Payer: Medicare Other | Attending: Emergency Medicine | Admitting: Emergency Medicine

## 2021-12-16 DIAGNOSIS — W01198A Fall on same level from slipping, tripping and stumbling with subsequent striking against other object, initial encounter: Secondary | ICD-10-CM | POA: Diagnosis not present

## 2021-12-16 DIAGNOSIS — S79912A Unspecified injury of left hip, initial encounter: Secondary | ICD-10-CM | POA: Diagnosis present

## 2021-12-16 DIAGNOSIS — S46012A Strain of muscle(s) and tendon(s) of the rotator cuff of left shoulder, initial encounter: Secondary | ICD-10-CM | POA: Diagnosis not present

## 2021-12-16 DIAGNOSIS — S46912A Strain of unspecified muscle, fascia and tendon at shoulder and upper arm level, left arm, initial encounter: Secondary | ICD-10-CM

## 2021-12-16 DIAGNOSIS — Z7984 Long term (current) use of oral hypoglycemic drugs: Secondary | ICD-10-CM | POA: Insufficient documentation

## 2021-12-16 DIAGNOSIS — S76012A Strain of muscle, fascia and tendon of left hip, initial encounter: Secondary | ICD-10-CM | POA: Diagnosis not present

## 2021-12-16 DIAGNOSIS — Y9301 Activity, walking, marching and hiking: Secondary | ICD-10-CM | POA: Diagnosis not present

## 2021-12-16 DIAGNOSIS — Z79899 Other long term (current) drug therapy: Secondary | ICD-10-CM | POA: Diagnosis not present

## 2021-12-16 DIAGNOSIS — W19XXXA Unspecified fall, initial encounter: Secondary | ICD-10-CM

## 2021-12-16 MED ORDER — TRAMADOL HCL 50 MG PO TABS
50.0000 mg | ORAL_TABLET | Freq: Once | ORAL | Status: AC
  Administered 2021-12-16: 50 mg via ORAL
  Filled 2021-12-16: qty 1

## 2021-12-16 NOTE — Discharge Instructions (Signed)
Follow-up with your primary care doctor.  Come back to ER if you develop worsening pain, any episodes of passing out, nausea, vomiting, headache, or other new concerning symptom. ?

## 2021-12-16 NOTE — ED Triage Notes (Signed)
Pt arrives pov, to triage in wheelchair, c/o mechanical fall when walking to car ~ 1 hr pta. Pt c/o left shoulder pain, facial pain, and dizziness after fall. Denies loc or thinners. Denies neck pain or tenderness, denies numbness ?

## 2021-12-16 NOTE — ED Provider Notes (Signed)
?[facility] EMERGENC[facility] DEPARTMENT ?Provider Note ? ? ?CSN: [id number] ?Arrival date & time: 12/16/21  1632 ? ?  ? ?History ? ?Chief Complaint  ?Patient presents with  ? Fall  ? ? ?Brianna Long is a 71 y.o. female.  Presenting to the emergency department with concern for fall.  Patient states that she fell when walking out to her car.  Thinks that she tripped on something and fell into the car door and then fell to the ground.  On impact with car door she thinks that she hit her face and has had a little bit of discomfort in her nose.  When she went to the ground she fell on her left hip and left shoulder.  States that currently she only has pain in left shoulder and left hip.  Moderate, aching, nonradiating.  She had no loss of consciousness, no lightheadedness, no headache, no vomiting.  No change in mental status. ? ?Reviewed case with patient's husband and daughter at bedside.  They state that patient is at her baseline mental status and has been acting appropriately. ? ?HPI ? ?  ? ?Home Medications ?Prior to Admission medications   ?Medication Sig Start Date End Date Taking? Authorizing Provider  ?allopurinol (ZYLOPRIM) 100 MG tablet Take 100 mg by mouth 2 (two) times daily.    [provider]  ?allopurinol (ZYLOPRIM) 300 MG tablet Take 1 tablet (300 mg total) by mouth daily. 12/15/12 12/15/13  Jonathon Resides, MD  ?ALPRAZolam Duanne Moron) 1 MG tablet Take 0.5-1 mg by mouth 2 (two) times daily as needed. For anxiety or sleep    [provider]  ?amLODipine (NORVASC) 10 MG tablet Take 10 mg by mouth daily.    [provider]  ?Canagliflozin (INVOKANA) 300 MG TABS Take 300 mg by mouth daily. 12/15/12   Jonathon Resides, MD  ?COLCRYS 0.6 MG tablet  11/13/12   [provider]  ?DULoxetine (CYMBALTA) 60 MG capsule Take 1 capsule (60 mg total) by mouth daily. 12/15/12   Jonathon Resides, MD  ?levothyroxine (SYNTHROID, LEVOTHROID) 100 MCG tablet Take 100 mcg by mouth daily.    [provider]  ?metFORMIN (GLUCOPHAGE-XR) 500 MG 24 hr tablet Take 1,000 mg by mouth at bedtime.     [provider]  ?omega-3 acid ethyl esters (LOVAZA) 1 G capsule Take 2 g by mouth 2 (two) times daily.    [provider]  ?pantoprazole (PROTONIX) 40 MG tablet Take 40 mg by mouth 2 (two) times daily.     [provider]  ?promethazine (PHENERGAN) 25 MG tablet Take 12.5 mg by mouth every 6 (six) hours as needed for nausea.    [provider]  ?rosuvastatin (CRESTOR) 10 MG tablet Take 1 tablet (10 mg total) by mouth daily. 12/15/12   Jonathon Resides, MD  ?TRAMADOL HCL ER PO Take by mouth.    [provider]  ?valsartan-hydrochlorothiazide (DIOVAN-HCT) 320-25 MG per tablet Take 1 tablet by mouth daily.    [provider]  ?   ? ?Allergies    ?Oxycodone-acetaminophen   ? ?Review of Systems   ?Review of Systems  ?Constitutional:  Negative for chills and fever.  ?HENT:  Negative for ear pain and sore throat.   ?Eyes:  Negative for pain and visual disturbance.  ?Respiratory:  Negative for cough and shortness of breath.   ?Cardiovascular:  Negative for chest pain and palpitations.  ?Gastrointestinal:  Negative for abdominal pain and vomiting.  ?Genitourinary:  Negative for dysuria and hematuria.  ?Musculoskeletal:  Positive for arthralgias. Negative for back pain.  ?Skin:  Negative for color change and rash.  ?Neurological:  Negative for seizures and syncope.  ?All other systems reviewed and are negative. ? ?Physical Exam ?Updated Vital Signs ?BP (!) 156/81 (BP Location: Right Arm)   Pulse 75   Temp 98.1 ?F (36.7 ?C) (Oral)   Resp 18   Ht 5\' 5"  (1.651 m)   Wt 81.6 kg   SpO2 96%   BMI 29.95 kg/m?  ?Physical Exam ?Vitals and nursing note reviewed.  ?Constitutional:   ?   General: She is not in acute distress. ?   Appearance: She is well-developed.  ?HENT:  ?   Head: Normocephalic and atraumatic.  ?Eyes:  ?   Conjunctiva/sclera: Conjunctivae normal.   ?Cardiovascular:  ?   Rate and Rhythm: Normal rate and regular rhythm.  ?   Heart sounds: No murmur heard. ?Pulmonary:  ?   Effort: Pulmonary effort is normal. No respiratory distress.  ?   Breath sounds: Normal breath sounds.  ?Abdominal:  ?   Palpations: Abdomen is soft.  ?   Tenderness: There is no abdominal tenderness.  ?Musculoskeletal:     ?   General: No swelling.  ?   Cervical back: Neck supple.  ?   Comments: Back: no C, T, L spine TTP, no step off or deformity ?RUE: no TTP throughout, no deformity, normal joint ROM, radial pulse intact, distal sensation and motor intact ?LUE: Some tenderness to the shoulder, no deformity, normal joint ROM, radial pulse intact, distal sensation and motor intact ?RLE:  no TTP throughout, no deformity, normal joint ROM, distal pulse, sensation and motor intact ?LLE: Some tenderness to the hip, no deformity, normal joint ROM, distal pulse, sensation and motor intact  ?Skin: ?   General: Skin is warm and dry.  ?   Capillary Refill: Capillary refill takes less than 2 seconds.  ?Neurological:  ?   Mental Status: She is alert.  ?Psychiatric:     ?   Mood and Affect: Mood normal.  ? ? ?ED Results / Procedures / Treatments   ?Labs ?(all labs ordered are listed, but only abnormal results are displayed) ?Labs Reviewed - No data to display ? ?EKG ?None ? ?Radiology ?DG Shoulder Left ? ?Result Date: 12/16/2021 ?CLINICAL DATA:  Shoulder pain after fall. EXAM: LEFT SHOULDER - 2+ VIEW COMPARISON:  None. FINDINGS: There is no evidence of fracture or dislocation. Mild glenohumeral and acromioclavicular degenerative change. Productive change along the greater tuberosity of the humerus suggestive of rotator cuff tendinopathy. Soft tissues are unremarkable. IMPRESSION: No acute fracture or dislocation. 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 Electronically Signed   By: Dahlia Bailiff M.D.   On: 12/16/2021 17:34  ? ?DG Hip  Unilat W or Wo Pelvis 2-3 Views Left ? ?Result Date: 12/16/2021 ?CLINICAL DATA:  Left hip pain.  Fall. EXAM: DG HIP (WITH OR WITHOUT PELVIS) 2-3V LEFT COMPARISON:  None. FINDINGS: There is no evidence of hip fracture or dislocation. Joint spaces are maintained. There are mild degenerative changes of the left sacroiliac joint. Soft tissues are within normal limits. IMPRESSION: 1. No acute bony abnormality. Electronically Signed   By: Ronney Asters M.D.   On: 12/16/2021 18:06   ? ?Procedures ?Procedures  ? ? ?Medications Ordered in ED ?Medications  ?traMADol (ULTRAM) tablet 50 mg (50 mg Oral Given 12/16/21 1759)  ? ? ?  ED Course/ Medical Decision Making/ A&P ?  ?                        ?Medical Decision Making ?Amount and/or Complexity of Data Reviewed ?Radiology: ordered. ? ?Risk ?Prescription drug management. ? ? ?71 year old lady presents to ER with concern for mechanical fall, possible facial trauma, shoulder pain, left hip pain.  On physical exam patient was well-appearing in no distress, noted some mild tenderness to her left shoulder and left hip.  Plain films negative.  I independently reviewed XR imaging.  She has no palpable deformity on careful inspection of her face or head.  Her initial impact was reported to be quite minor and given that she has no ongoing headache, nausea, vomiting, LOC, and reassuring physical exam, do not feel she requires CT imaging of her brain at this time.  I reviewed return precautions with patient and advised follow-up with primary care.  Discharged home. ? ? ? ?After the discussed management above, the patient was determined to be safe for discharge.  The patient was in agreement with this plan and all questions regarding their care were answered.  ED return precautions were discussed and the patient will return to the ED with any significant worsening of condition. ? ? ? ? ? ? ? ? ?Final Clinical Impression(s) / ED Diagnoses ?Final diagnoses:  ?Fall, initial encounter  ?Strain of  left hip, initial encounter  ?Strain of left shoulder, initial encounter  ? ? ?Rx / DC Orders ?ED Discharge Orders   ? ? None  ? ?  ? ? ?  ?Lucrezia Starch, MD ?12/16/21 2359 ? ?

## 2021-12-20 ENCOUNTER — Encounter (HOSPITAL_BASED_OUTPATIENT_CLINIC_OR_DEPARTMENT_OTHER): Payer: Self-pay

## 2021-12-20 ENCOUNTER — Other Ambulatory Visit: Payer: Self-pay

## 2021-12-20 ENCOUNTER — Emergency Department (HOSPITAL_BASED_OUTPATIENT_CLINIC_OR_DEPARTMENT_OTHER)
Admission: EM | Admit: 2021-12-20 | Discharge: 2021-12-20 | Disposition: A | Discharge status: Home / Self Care | Payer: Medicare Other | Attending: Emergency Medicine | Admitting: Emergency Medicine

## 2021-12-20 ENCOUNTER — Emergency Department (HOSPITAL_BASED_OUTPATIENT_CLINIC_OR_DEPARTMENT_OTHER): Payer: Medicare Other

## 2021-12-20 DIAGNOSIS — W19XXXA Unspecified fall, initial encounter: Secondary | ICD-10-CM | POA: Insufficient documentation

## 2021-12-20 DIAGNOSIS — M545 Low back pain, unspecified: Secondary | ICD-10-CM | POA: Insufficient documentation

## 2021-12-20 LAB — URINALYSIS, ROUTINE W REFLEX MICROSCOPIC
Bilirubin Urine: NEGATIVE
Glucose, UA: NEGATIVE mg/dL
Ketones, ur: NEGATIVE mg/dL
Leukocytes,Ua: NEGATIVE
Nitrite: NEGATIVE
Protein, ur: 100 mg/dL — AB
Specific Gravity, Urine: >=1.03 (ref 1.005–1.030)
pH: 6 (ref 5.0–8.0)

## 2021-12-20 LAB — URINALYSIS, MICROSCOPIC (REFLEX)

## 2021-12-20 MED ORDER — KETOROLAC TROMETHAMINE 60 MG/2ML IM SOLN
30.0000 mg | Freq: Once | INTRAMUSCULAR | Status: AC
  Administered 2021-12-20: 30 mg via INTRAMUSCULAR
  Filled 2021-12-20: qty 2

## 2021-12-20 MED ORDER — LIDOCAINE 5 % EX PTCH
1.0000 | MEDICATED_PATCH | CUTANEOUS | Status: DC
  Administered 2021-12-20: 1 via TRANSDERMAL
  Filled 2021-12-20: qty 1

## 2021-12-20 MED ORDER — LIDOCAINE 5 % EX PTCH: 1.0000 | MEDICATED_PATCH | CUTANEOUS | 0 refills | Status: AC

## 2021-12-20 NOTE — ED Triage Notes (Signed)
States fell on Tuesday, seen here and had imaging. C/o right lumbar back pain. Taking pain meds without relief. ?

## 2021-12-20 NOTE — Discharge Instructions (Signed)
You were seen in the emergency department for worsening right-sided back pain.  Your x-ray did not show any obvious fracture.  Your urine did not show any signs of infection.  We are prescribing you Lidoderm patches to use as needed for pain.  Please schedule a follow-up appointment with your primary care doctor.  Return to the emergency department if any worsening or concerning symptoms. ?

## 2021-12-20 NOTE — ED Provider Notes (Signed)
?[facility] EMERGENCY DEPARTMENT ?Provider Note ? ? ?CSN: [id number] ?Arrival date & time: 12/20/21  1951 ? ?  ? ?History ? ?Chief Complaint  ?Patient presents with  ? Back Pain  ? ? ?Brianna Long is a 71 y.o. female.  He has a history of chronic joint and back pain.  Complaining of right-sided low back pain that started yesterday.  Worse with bending twisting and moving.  No urinary symptoms no abdominal pain numbness or weakness.  No bowel or bladder incontinence.  Has tried her Lyrica and tramadol without any improvement.  She did have a fall 3 days ago but does not think she injured her back at that time. ? ?The history is provided by the patient.  ?Back Pain ?Location:  Lumbar spine ?Quality:  Stabbing ?Radiates to:  Does not radiate ?Pain severity:  Severe ?Pain is:  Same all the time ?Onset quality:  Gradual ?Duration:  2 days ?Timing:  Constant ?Progression:  Unchanged ?Chronicity:  Recurrent ?Context: falling   ?Relieved by:  Nothing ?Worsened by:  Bending and twisting ?Ineffective treatments:  Bed rest ?Associated symptoms: no abdominal pain, no bladder incontinence, no bowel incontinence, no chest pain, no dysuria, no fever, no numbness and no tingling   ?Risk factors: no hx of cancer   ? ?  ? ?Home Medications ?Prior to Admission medications   ?Medication Sig Start Date End Date Taking? Authorizing Provider  ?allopurinol (ZYLOPRIM) 100 MG tablet Take 100 mg by mouth 2 (two) times daily.    [provider]  ?allopurinol (ZYLOPRIM) 300 MG tablet Take 1 tablet (300 mg total) by mouth daily. 12/15/12 12/15/13  Gillian Scarce, MD  ?ALPRAZolam Prudy Feeler) 1 MG tablet Take 0.5-1 mg by mouth 2 (two) times daily as needed. For anxiety or sleep    [provider]  ?amLODipine (NORVASC) 10 MG tablet Take 10 mg by mouth daily.    [provider]  ?Canagliflozin (INVOKANA) 300 MG TABS Take 300 mg by mouth daily. 12/15/12   Gillian Scarce, MD  ?COLCRYS 0.6 MG tablet  11/13/12   [provider]  ?DULoxetine (CYMBALTA) 60 MG capsule Take 1 capsule (60 mg total) by mouth daily. 12/15/12   Gillian Scarce, MD  ?levothyroxine (SYNTHROID, LEVOTHROID) 100 MCG tablet Take 100 mcg by mouth daily.    [provider]  ?metFORMIN (GLUCOPHAGE-XR) 500 MG 24 hr tablet Take 1,000 mg by mouth at bedtime.     [provider]  ?omega-3 acid ethyl esters (LOVAZA) 1 G capsule Take 2 g by mouth 2 (two) times daily.    [provider]  ?pantoprazole (PROTONIX) 40 MG tablet Take 40 mg by mouth 2 (two) times daily.     [provider]  ?promethazine (PHENERGAN) 25 MG tablet Take 12.5 mg by mouth every 6 (six) hours as needed for nausea.    [provider]  ?rosuvastatin (CRESTOR) 10 MG tablet Take 1 tablet (10 mg total) by mouth daily. 12/15/12   Gillian Scarce, MD  ?TRAMADOL HCL ER PO Take by mouth.    [provider]  ?valsartan-hydrochlorothiazide (DIOVAN-HCT) 320-25 MG per tablet Take 1 tablet by mouth daily.    [provider]  ?   ? ?Allergies    ?Oxycodone-acetaminophen   ? ?Review of Systems   ?Review of Systems  ?Constitutional:  Negative for fever.  ?HENT:  Negative for sore throat.   ?Respiratory:  Negative for shortness of breath.   ?Cardiovascular:  Negative  for chest pain.  ?Gastrointestinal:  Negative for abdominal pain and bowel incontinence.  ?Genitourinary:  Negative for bladder incontinence and dysuria.  ?Musculoskeletal:  Positive for back pain.  ?Skin:  Negative for rash.  ?Neurological:  Negative for tingling and numbness.  ? ?Physical Exam ?Updated Vital Signs ?BP (!) 147/68 (BP Location: Right Arm)   Pulse 62   Temp 99.8 ?F (37.7 ?C) (Oral)   Resp 20   Ht 5\' 5"  (1.651 m)   Wt 81.6 kg   SpO2 98%   BMI 29.95 kg/m?  ?Physical Exam ?Vitals and nursing note reviewed.  ?Constitutional:   ?   General: She is not in acute distress. ?   Appearance: Normal appearance. She is well-developed.  ?HENT:  ?   Head: Normocephalic and  atraumatic.  ?Eyes:  ?   Conjunctiva/sclera: Conjunctivae normal.  ?Cardiovascular:  ?   Rate and Rhythm: Normal rate and regular rhythm.  ?   Heart sounds: No murmur heard. ?Pulmonary:  ?   Effort: Pulmonary effort is normal. No respiratory distress.  ?   Breath sounds: Normal breath sounds.  ?Abdominal:  ?   Palpations: Abdomen is soft.  ?   Tenderness: There is no abdominal tenderness. There is no guarding or rebound.  ?Musculoskeletal:     ?   General: Tenderness present. No swelling.  ?   Cervical back: Neck supple.  ?   Comments: She has no midline spine tenderness.  She does have right paralumbar tenderness reproducible by palpation.  ?Skin: ?   General: Skin is warm and dry.  ?   Capillary Refill: Capillary refill takes less than 2 seconds.  ?Neurological:  ?   General: No focal deficit present.  ?   Mental Status: She is alert.  ?   Sensory: No sensory deficit.  ?   Motor: No weakness.  ? ? ?ED Results / Procedures / Treatments   ?Labs ?(all labs ordered are listed, but only abnormal results are displayed) ?Labs Reviewed  ?URINALYSIS, ROUTINE W REFLEX MICROSCOPIC - Abnormal; Notable for the following components:  ?    Result Value  ? APPearance HAZY (*)   ? Hgb urine dipstick TRACE (*)   ? Protein, ur 100 (*)   ? All other components within normal limits  ?URINALYSIS, MICROSCOPIC (REFLEX) - Abnormal; Notable for the following components:  ? Bacteria, UA FEW (*)   ? Non Squamous Epithelial PRESENT (*)   ? All other components within normal limits  ? ? ?EKG ?None ? ?Radiology ?DG Lumbar Spine Complete ? ?Result Date: 12/20/2021 ?CLINICAL DATA:  Right-sided low back pain since falling 4 days ago. EXAM: LUMBAR SPINE - COMPLETE 4+ VIEW COMPARISON:  Radiographs 11/09/2008.  MRI 06/17/2018. FINDINGS: Transitional lumbosacral anatomy. In correlation with prior chest radiographs, there are 12 pairs of ribs; hence, the transitional segment is assigned S1 and is nearly fully lumbarized. There is a mild convex right  scoliosis. The lateral alignment is near anatomic. There is mild multilevel spondylosis which has progressed, especially at L3-4. No evidence of acute fracture or pars defect. There are mild facet degenerative changes and mild aortic atherosclerosis. IMPRESSION: No evidence of acute lumbar spine injury.  Progressive spondylosis. Electronically Signed   By: 08/17/2018 M.D.   On: 12/20/2021 21:09   ? ?Procedures ?Procedures  ? ? ?Medications Ordered in ED ?Medications  ?ketorolac (TORADOL) injection 30 mg (has no administration in time range)  ? ? ?ED Course/ Medical Decision Making/ A&P ?Clinical Course as  of 12/21/21 0942  ?Sat Dec 20, 2021  ?2130 Patient states her pain is better after medications.  I told her I would prescribe her the Lidoderm patches although some insurances do not cover it. [MB]  ?  ?Clinical Course User Index ?[MB] Terrilee FilesButler, Lise Pincus C, MD  ? ?                        ?Medical Decision Making ?Amount and/or Complexity of Data Reviewed ?Labs: ordered. ?Radiology: ordered. ? ?Risk ?Prescription drug management. ? ?71 year old female with chronic neck and back pain here with right-sided low back pain after recent fall.  Neurologically intact.  Urinalysis without clear signs of infection.  X-rays do not show any acute fracture.  Symptomatically improved with some Toradol and Lidoderm patch.  Will treat with Lidoderm patch and recommended close follow-up with treatment team.  Return instructions discussed. ? ? ? ? ? ? ? ? ?Final Clinical Impression(s) / ED Diagnoses ?Final diagnoses:  ?Acute right-sided low back pain without sciatica  ? ? ?Rx / DC Orders ?ED Discharge Orders   ? ?      Ordered  ?  lidocaine (LIDODERM) 5 %  Every 24 hours       ? 12/20/21 2146  ? ?  ?  ? ?  ? ? ?  ?Terrilee FilesButler, Monterrio Gerst C, MD ?12/21/21 [phone] ? ?

## 2021-12-20 NOTE — ED Notes (Signed)
Patient transported to X-ray 

## 2022-03-19 ENCOUNTER — Ambulatory Visit: Payer: Medicare Other | Admitting: Podiatry

## 2022-04-01 ENCOUNTER — Ambulatory Visit (INDEPENDENT_AMBULATORY_CARE_PROVIDER_SITE_OTHER): Payer: Medicare Other | Admitting: Podiatry

## 2022-04-01 DIAGNOSIS — B351 Tinea unguium: Secondary | ICD-10-CM | POA: Diagnosis not present

## 2022-04-01 DIAGNOSIS — M79674 Pain in right toe(s): Secondary | ICD-10-CM

## 2022-04-01 DIAGNOSIS — M79675 Pain in left toe(s): Secondary | ICD-10-CM | POA: Diagnosis not present

## 2022-04-01 NOTE — Progress Notes (Signed)
  Subjective:  Patient ID: Brianna Long, female    DOB: [birth year],  MRN: [id number]  Chief Complaint  Patient presents with   Foot Problem    RM 13  DFC, numbness located in the right foot    71 y.o. female returns for the above complaint.  Patient thickened elongated dystrophic toenails x10 mild pain on palpation.  Patient states she is not able to go down so she would like for me to do it.  She denies any other acute complaints.  Objective:  There were no vitals filed for this visit. Podiatric Exam: Vascular: dorsalis pedis and posterior tibial pulses are palpable bilateral. Capillary return is immediate. Temperature gradient is WNL. Skin turgor WNL  Sensorium: Normal Semmes Weinstein monofilament test. Normal tactile sensation bilaterally. Nail Exam: Pt has thick disfigured discolored nails with subungual debris noted bilateral entire nail hallux through fifth toenails.  Pain on palpation to the nails. Ulcer Exam: There is no evidence of ulcer or pre-ulcerative changes or infection. Orthopedic Exam: Muscle tone and strength are WNL. No limitations in general ROM. No crepitus or effusions noted.  Skin: No Porokeratosis. No infection or ulcers    Assessment & Plan:   1. Pain due to onychomycosis of toenails of both feet     Patient was evaluated and treated and all questions answered.  Onychomycosis with pain  -Nails palliatively debrided as below. -Educated on self-care  Procedure: Nail Debridement Rationale: pain  Type of Debridement: manual, sharp debridement. Instrumentation: Nail nipper, rotary burr. Number of Nails: 10  Procedures and Treatment: Consent by patient was obtained for treatment procedures. The patient understood the discussion of treatment and procedures well. All questions were answered thoroughly reviewed. Debridement of mycotic and hypertrophic toenails, 1 through 5 bilateral and clearing of subungual debris. No ulceration, no infection noted.   Return Visit-Office Procedure: Patient instructed to return to the office for a follow up visit 3 months for continued evaluation and treatment.  Nicholes Rough, DPM    Return in about 3 months (around 07/02/2022) for Routine Foot Care, Sched with  for Diabetic shoes/insoles.

## 2022-04-16 ENCOUNTER — Other Ambulatory Visit: Payer: Medicare Other

## 2022-06-04 ENCOUNTER — Emergency Department (HOSPITAL_BASED_OUTPATIENT_CLINIC_OR_DEPARTMENT_OTHER)
Admission: EM | Admit: 2022-06-04 | Discharge: 2022-06-04 | Disposition: A | Discharge status: Home / Self Care | Payer: Medicare Other | Attending: Emergency Medicine | Admitting: Emergency Medicine

## 2022-06-04 ENCOUNTER — Encounter (HOSPITAL_BASED_OUTPATIENT_CLINIC_OR_DEPARTMENT_OTHER): Payer: Self-pay | Admitting: Emergency Medicine

## 2022-06-04 ENCOUNTER — Emergency Department (HOSPITAL_BASED_OUTPATIENT_CLINIC_OR_DEPARTMENT_OTHER): Payer: Medicare Other

## 2022-06-04 ENCOUNTER — Other Ambulatory Visit: Payer: Self-pay

## 2022-06-04 DIAGNOSIS — Z20822 Contact with and (suspected) exposure to covid-19: Secondary | ICD-10-CM | POA: Insufficient documentation

## 2022-06-04 DIAGNOSIS — Z7984 Long term (current) use of oral hypoglycemic drugs: Secondary | ICD-10-CM | POA: Insufficient documentation

## 2022-06-04 DIAGNOSIS — Z79899 Other long term (current) drug therapy: Secondary | ICD-10-CM | POA: Diagnosis not present

## 2022-06-04 DIAGNOSIS — I2510 Atherosclerotic heart disease of native coronary artery without angina pectoris: Secondary | ICD-10-CM | POA: Diagnosis not present

## 2022-06-04 DIAGNOSIS — I10 Essential (primary) hypertension: Secondary | ICD-10-CM | POA: Insufficient documentation

## 2022-06-04 DIAGNOSIS — E119 Type 2 diabetes mellitus without complications: Secondary | ICD-10-CM | POA: Diagnosis not present

## 2022-06-04 DIAGNOSIS — J209 Acute bronchitis, unspecified: Secondary | ICD-10-CM | POA: Insufficient documentation

## 2022-06-04 DIAGNOSIS — R059 Cough, unspecified: Secondary | ICD-10-CM | POA: Diagnosis present

## 2022-06-04 DIAGNOSIS — R001 Bradycardia, unspecified: Secondary | ICD-10-CM | POA: Insufficient documentation

## 2022-06-04 LAB — CBC WITH DIFFERENTIAL/PLATELET
Abs Immature Granulocytes: 0.03 10*3/uL (ref 0.00–0.07)
Basophils Absolute: 0.1 10*3/uL (ref 0.0–0.1)
Basophils Relative: 1 %
Eosinophils Absolute: 0 10*3/uL (ref 0.0–0.5)
Eosinophils Relative: 0 %
HCT: 41.2 % (ref 36.0–46.0)
Hemoglobin: 13.6 g/dL (ref 12.0–15.0)
Immature Granulocytes: 0 %
Lymphocytes Relative: 25 %
Lymphs Abs: 2.2 10*3/uL (ref 0.7–4.0)
MCH: 27.3 pg (ref 26.0–34.0)
MCHC: 33 g/dL (ref 30.0–36.0)
MCV: 82.6 fL (ref 80.0–100.0)
Monocytes Absolute: 0.6 10*3/uL (ref 0.1–1.0)
Monocytes Relative: 7 %
Neutro Abs: 6.1 10*3/uL (ref 1.7–7.7)
Neutrophils Relative %: 67 %
Platelets: 259 10*3/uL (ref 150–400)
RBC: 4.99 MIL/uL (ref 3.87–5.11)
RDW: 15 % (ref 11.5–15.5)
WBC: 9 10*3/uL (ref 4.0–10.5)
nRBC: 0 % (ref 0.0–0.2)

## 2022-06-04 LAB — COMPREHENSIVE METABOLIC PANEL
ALT: 15 U/L (ref 0–44)
AST: 22 U/L (ref 15–41)
Albumin: 3.6 g/dL (ref 3.5–5.0)
Alkaline Phosphatase: 84 U/L (ref 38–126)
Anion gap: 6 (ref 5–15)
BUN: 16 mg/dL (ref 8–23)
CO2: 22 mmol/L (ref 22–32)
Calcium: 9.4 mg/dL (ref 8.9–10.3)
Chloride: 114 mmol/L — ABNORMAL HIGH (ref 98–111)
Creatinine, Ser: 0.99 mg/dL (ref 0.44–1.00)
GFR, Estimated: >60 mL/min (ref >60)
Glucose, Bld: 101 mg/dL — ABNORMAL HIGH (ref 70–99)
Potassium: 3.8 mmol/L (ref 3.5–5.1)
Sodium: 142 mmol/L (ref 135–145)
Total Bilirubin: 0.5 mg/dL (ref 0.3–1.2)
Total Protein: 7.6 g/dL (ref 6.5–8.1)

## 2022-06-04 LAB — SARS CORONAVIRUS 2 BY RT PCR: SARS Coronavirus 2 by RT PCR: NEGATIVE

## 2022-06-04 MED ORDER — ALBUTEROL SULFATE HFA 108 (90 BASE) MCG/ACT IN AERS: 1.0000 | INHALATION_SPRAY | Freq: Four times a day (QID) | RESPIRATORY_TRACT | 0 refills | Status: AC | PRN

## 2022-06-04 MED ORDER — IPRATROPIUM BROMIDE 0.02 % IN SOLN
0.5000 mg | Freq: Once | RESPIRATORY_TRACT | Status: AC
  Administered 2022-06-04: 0.5 mg via RESPIRATORY_TRACT
  Filled 2022-06-04: qty 2.5

## 2022-06-04 MED ORDER — ALBUTEROL SULFATE (2.5 MG/3ML) 0.083% IN NEBU
5.0000 mg | INHALATION_SOLUTION | Freq: Once | RESPIRATORY_TRACT | Status: AC
Start: 2022-06-04 — End: 2022-06-04
  Administered 2022-06-04: 5 mg via RESPIRATORY_TRACT
  Filled 2022-06-04: qty 6

## 2022-06-04 MED ORDER — BENZONATATE 100 MG PO CAPS: 100.0000 mg | ORAL_CAPSULE | Freq: Three times a day (TID) | ORAL | 0 refills | Status: AC

## 2022-06-04 MED ORDER — AZITHROMYCIN 250 MG PO TABS: ORAL_TABLET | ORAL | 0 refills | Status: AC

## 2022-06-04 MED ORDER — SODIUM CHLORIDE 0.9 % IV BOLUS
1000.0000 mL | Freq: Once | INTRAVENOUS | Status: AC
Start: 2022-06-04 — End: 2022-06-04
  Administered 2022-06-04: 1000 mL via INTRAVENOUS

## 2022-06-04 NOTE — Discharge Instructions (Addendum)
You likely have bronchitis or early pneumonia.  Please take Z-Pak as prescribed  Use albuterol every 6 hours as needed for wheezing  Take Tessalon Perles as needed for cough  Your COVID test is negative today  See your doctor for follow-up  Return to ER if you have worse cough, wheezing, trouble breathing, fever

## 2022-06-04 NOTE — ED Notes (Signed)
D/c paperwork reviewed with pt, incl prescriptions. All questions addressed prior to d/c. Pt assisted to wheelchair by family member and wheeled to ED exit. NAD.

## 2022-06-04 NOTE — ED Provider Notes (Signed)
[facility] Provider Note   CSN: [id number] Arrival date & time: 06/04/22  1557     History  Chief Complaint  Patient presents with   Cough    Brianna Long is a 71 y.o. female history of HTN, CAD, diabetes, here presenting with cough and fatigue.  Patient states that for the last week or so, she has been having chills and fatigue and nonproductive cough.  Patient had a recent MRI for follow-up for her renal mass.  Denies any urinary symptoms.  The history is provided by the patient.       Home Medications Prior to Admission medications   Medication Sig Start Date End Date Taking? Authorizing Provider  albuterol (VENTOLIN HFA) 108 (90 Base) MCG/ACT inhaler Inhale 1-2 puffs into the lungs every 6 (six) hours as needed for wheezing or shortness of breath. 06/04/22  Yes Drenda Freeze, MD  azithromycin (ZITHROMAX Z-PAK) 250 MG tablet 2 po day one, then 1 daily x 4 days 06/04/22  Yes Drenda Freeze, MD  benzonatate (TESSALON) 100 MG capsule Take 1 capsule (100 mg total) by mouth every 8 (eight) hours. 06/04/22  Yes Drenda Freeze, MD  allopurinol (ZYLOPRIM) 100 MG tablet Take 100 mg by mouth 2 (two) times daily.    [provider]  allopurinol (ZYLOPRIM) 300 MG tablet Take 1 tablet (300 mg total) by mouth daily. 12/15/12 12/15/13  Jonathon Resides, MD  ALPRAZolam Duanne Moron) 1 MG tablet Take 0.5-1 mg by mouth 2 (two) times daily as needed. For anxiety or sleep    [provider]  amLODipine (NORVASC) 10 MG tablet Take 10 mg by mouth daily.    [provider]  Canagliflozin (INVOKANA) 300 MG TABS Take 300 mg by mouth daily. 12/15/12   Jonathon Resides, MD  COLCRYS 0.6 MG tablet  11/13/12   [provider]  DULoxetine (CYMBALTA) 60 MG capsule Take 1 capsule (60 mg total) by mouth daily. 12/15/12   Zanard, Bernadene Bell, MD  levothyroxine (SYNTHROID, LEVOTHROID) 100 MCG tablet Take 100 mcg by mouth daily.    [provider]   lidocaine (LIDODERM) 5 % Place 1 patch onto the skin daily. Remove & Discard patch within 12 hours or as directed by MD 12/20/21   Hayden Rasmussen, MD  metFORMIN (GLUCOPHAGE-XR) 500 MG 24 hr tablet Take 1,000 mg by mouth at bedtime.     [provider]  omega-3 acid ethyl esters (LOVAZA) 1 G capsule Take 2 g by mouth 2 (two) times daily.    [provider]  pantoprazole (PROTONIX) 40 MG tablet Take 40 mg by mouth 2 (two) times daily.     [provider]  promethazine (PHENERGAN) 25 MG tablet Take 12.5 mg by mouth every 6 (six) hours as needed for nausea.    [provider]  rosuvastatin (CRESTOR) 10 MG tablet Take 1 tablet (10 mg total) by mouth daily. 12/15/12   Jonathon Resides, MD  TRAMADOL HCL ER PO Take by mouth.    [provider]  valsartan-hydrochlorothiazide (DIOVAN-HCT) 320-25 MG per tablet Take 1 tablet by mouth daily.    [provider]      Allergies    Oxycodone-acetaminophen    Review of Systems   Review of Systems  Respiratory:  Positive for cough.   All other systems reviewed and are negative.   Physical Exam Updated Vital Signs BP (!) 112/96   Pulse (!) 55   Temp 98.3  F (36.8 C) (Oral)   Resp (!) 21   Ht 5\' 5"  (1.651 m)   Wt 77.1 kg   SpO2 98%   BMI 28.29 kg/m  Physical Exam Vitals and nursing note reviewed.  Constitutional:      Appearance: Normal appearance.  HENT:     Head: Normocephalic.     Nose: Nose normal.     Mouth/Throat:     Mouth: Mucous membranes are moist.  Eyes:     Extraocular Movements: Extraocular movements intact.     Pupils: Pupils are equal, round, and reactive to light.  Cardiovascular:     Rate and Rhythm: Normal rate and regular rhythm.     Pulses: Normal pulses.     Heart sounds: Normal heart sounds.  Pulmonary:     Comments: Slightly tachypneic and mild diffuse wheezing. Abdominal:     General: Abdomen is flat.     Palpations: Abdomen is soft.  Musculoskeletal:         General: Normal range of motion.     Cervical back: Normal range of motion and neck supple.  Skin:    General: Skin is warm.     Capillary Refill: Capillary refill takes less than 2 seconds.  Neurological:     General: No focal deficit present.     Mental Status: She is alert and oriented to person, place, and time.  Psychiatric:        Mood and Affect: Mood normal.        Behavior: Behavior normal.     ED Results / Procedures / Treatments   Labs (all labs ordered are listed, but only abnormal results are displayed) Labs Reviewed  COMPREHENSIVE METABOLIC PANEL - Abnormal; Notable for the following components:      Result Value   Chloride 114 (*)    Glucose, Bld 101 (*)    All other components within normal limits  SARS CORONAVIRUS 2 BY RT PCR  CBC WITH DIFFERENTIAL/PLATELET    EKG EKG Interpretation  Date/Time:  Thursday June 04 2022 16:05:51 EDT Ventricular Rate:  58 PR Interval:  152 QRS Duration: 120 QT Interval:  456 QTC Calculation: 447 R Axis:   -46 Text Interpretation: Sinus bradycardia Left anterior fascicular block Left ventricular hypertrophy with QRS widening and repolarization abnormality ( R in aVL , Cornell product , Romhilt-Estes ) Abnormal ECG No previous ECGs available Confirmed by 07-01-1998 Richardean Canal) on 06/04/2022 4:47:23 PM  Radiology DG Chest Port 1 View  Result Date: 06/04/2022 CLINICAL DATA:  Cough, shortness of breath EXAM: PORTABLE CHEST 1 VIEW COMPARISON:  Previous studies including the examination of 05/01/2012 FINDINGS: Transverse diameter of heart is increased. Central pulmonary vessels are prominent. Right hilum appears more prominent than left. There are no signs of alveolar pulmonary edema. There is no focal consolidation in the peripheral lung fields. There is no pleural effusion or pneumothorax. IMPRESSION: Cardiomegaly. There are no signs of pulmonary edema or focal pulmonary consolidation. Central pulmonary vessels are prominent,  possibly suggesting mild CHF. Right hilum is more prominent than left which may be due to prominent central vessels or lymphadenopathy. Electronically Signed   By: 05/03/2012 M.D.   On: 06/04/2022 17:02    Procedures Procedures    Medications Ordered in ED Medications  albuterol (PROVENTIL) (2.5 MG/3ML) 0.083% nebulizer solution 5 mg (5 mg Nebulization Given 06/04/22 1656)  ipratropium (ATROVENT) nebulizer solution 0.5 mg (0.5 mg Nebulization Given 06/04/22 1656)  sodium chloride 0.9 % bolus 1,000 mL (0  mLs Intravenous Stopped 06/04/22 1753)    ED Course/ Medical Decision Making/ A&P                           Medical Decision Making Brianna Long is a 71 y.o. female here presenting with cough and congestion.  Patient has wheezing on exam.  I think likely bronchitis versus pneumonia versus COVID.  Plan to get CBC and CMP and COVID test and chest x-ray.  5:58 PM Chest x-ray showed mild fluid overload versus lymphadenopathy.  I think likely viral or early pneumonia.  Patient's COVID test is negative.  White blood cell count is normal.  Clinically, patient does not appear volume overloaded.  We will discharge patient with a Z-Pak and Tessalon Perles.  We will also give albuterol as needed for cough.  Wheezing has resolved after given albuterol.   Problems Addressed: Acute bronchitis, unspecified organism: acute illness or injury  Amount and/or Complexity of Data Reviewed Labs: ordered. Decision-making details documented in ED Course. Radiology: ordered and independent interpretation performed. Decision-making details documented in ED Course.  Risk Prescription drug management.    Final Clinical Impression(s) / ED Diagnoses Final diagnoses:  Acute bronchitis, unspecified organism    Rx / DC Orders ED Discharge Orders          Ordered    azithromycin (ZITHROMAX Z-PAK) 250 MG tablet        06/04/22 1753    albuterol (VENTOLIN HFA) 108 (90 Base) MCG/ACT inhaler  Every 6  hours PRN        06/04/22 1753    benzonatate (TESSALON) 100 MG capsule  Every 8 hours        06/04/22 1753              Charlynne Pander, MD 06/04/22 1800

## 2022-06-04 NOTE — ED Triage Notes (Addendum)
Cough, shob, congestion, fatigue since Tuesday. Denies pain. States cough has somewhat improved.

## 2022-06-04 NOTE — ED Notes (Signed)
Pt fluid bolus stopped, per EDP Yao request.

## 2022-07-08 ENCOUNTER — Ambulatory Visit: Payer: Medicare Other | Admitting: Podiatry

## 2023-05-26 ENCOUNTER — Emergency Department (HOSPITAL_BASED_OUTPATIENT_CLINIC_OR_DEPARTMENT_OTHER)
Admission: EM | Admit: 2023-05-26 | Discharge: 2023-05-26 | Disposition: A | Discharge status: Home / Self Care | Payer: Medicare Other | Attending: Emergency Medicine | Admitting: Emergency Medicine

## 2023-05-26 ENCOUNTER — Other Ambulatory Visit: Payer: Self-pay

## 2023-05-26 ENCOUNTER — Encounter (HOSPITAL_BASED_OUTPATIENT_CLINIC_OR_DEPARTMENT_OTHER): Payer: Self-pay

## 2023-05-26 ENCOUNTER — Emergency Department (HOSPITAL_BASED_OUTPATIENT_CLINIC_OR_DEPARTMENT_OTHER): Payer: Medicare Other

## 2023-05-26 DIAGNOSIS — F17210 Nicotine dependence, cigarettes, uncomplicated: Secondary | ICD-10-CM | POA: Diagnosis not present

## 2023-05-26 DIAGNOSIS — R0602 Shortness of breath: Secondary | ICD-10-CM | POA: Diagnosis not present

## 2023-05-26 DIAGNOSIS — I5020 Unspecified systolic (congestive) heart failure: Secondary | ICD-10-CM | POA: Diagnosis not present

## 2023-05-26 DIAGNOSIS — R001 Bradycardia, unspecified: Secondary | ICD-10-CM | POA: Insufficient documentation

## 2023-05-26 LAB — CBC WITH DIFFERENTIAL/PLATELET
Abs Immature Granulocytes: 0.05 10*3/uL (ref 0.00–0.07)
Basophils Absolute: 0 10*3/uL (ref 0.0–0.1)
Basophils Relative: 0 %
Eosinophils Absolute: 0 10*3/uL (ref 0.0–0.5)
Eosinophils Relative: 0 %
HCT: 40.4 % (ref 36.0–46.0)
Hemoglobin: 13.3 g/dL (ref 12.0–15.0)
Immature Granulocytes: 1 %
Lymphocytes Relative: 26 %
Lymphs Abs: 2.3 10*3/uL (ref 0.7–4.0)
MCH: 27.8 pg (ref 26.0–34.0)
MCHC: 32.9 g/dL (ref 30.0–36.0)
MCV: 84.3 fL (ref 80.0–100.0)
Monocytes Absolute: 0.8 10*3/uL (ref 0.1–1.0)
Monocytes Relative: 10 %
Neutro Abs: 5.4 10*3/uL (ref 1.7–7.7)
Neutrophils Relative %: 63 %
Platelets: 203 10*3/uL (ref 150–400)
RBC: 4.79 MIL/uL (ref 3.87–5.11)
RDW: 15.5 % (ref 11.5–15.5)
WBC: 8.6 10*3/uL (ref 4.0–10.5)
nRBC: 0 % (ref 0.0–0.2)

## 2023-05-26 LAB — BASIC METABOLIC PANEL
Anion gap: 10 (ref 5–15)
BUN: 23 mg/dL (ref 8–23)
CO2: 19 mmol/L — ABNORMAL LOW (ref 22–32)
Calcium: 9.2 mg/dL (ref 8.9–10.3)
Chloride: 111 mmol/L (ref 98–111)
Creatinine, Ser: 0.98 mg/dL (ref 0.44–1.00)
GFR, Estimated: >60 mL/min (ref >60)
Glucose, Bld: 84 mg/dL (ref 70–99)
Potassium: 3.6 mmol/L (ref 3.5–5.1)
Sodium: 140 mmol/L (ref 135–145)

## 2023-05-26 LAB — MAGNESIUM: Magnesium: 1.9 mg/dL (ref 1.7–2.4)

## 2023-05-26 LAB — BRAIN NATRIURETIC PEPTIDE: B Natriuretic Peptide: 332.5 pg/mL — ABNORMAL HIGH (ref 0.0–100.0)

## 2023-05-26 LAB — TROPONIN I (HIGH SENSITIVITY): Troponin I (High Sensitivity): 16 ng/L (ref <18)

## 2023-05-26 NOTE — ED Triage Notes (Signed)
Pt presents to ED from home C/O SOB, DOE, generalized weakness. Day 8 COVID +  HR noted to be in 30s, radial pulse 34. Charge RN made aware.

## 2023-05-26 NOTE — ED Notes (Signed)
Patient transported to X-ray 

## 2023-05-26 NOTE — Discharge Instructions (Signed)
You were diagnosed with symptomatic bradycardia, which is a low heart rate.  You do take a medication called carvedilol which can cause a low heart rate.  Please do not take carvedilol (coreg) until you are seen by your family doctor and/or cardiologist.  Please schedule an appointment with your family doctor within the next 2 weeks or sooner.

## 2023-05-26 NOTE — ED Notes (Signed)
ED Provider at bedside. 

## 2023-05-26 NOTE — ED Provider Notes (Signed)
[place] [facility] Provider Note   CSN: [id number] Arrival date & time: 05/26/23  1545     History  Chief Complaint  Patient presents with   Shortness of Breath    Anousha Hanmer is a 72 y.o. female with a past medical history significant for HFrEF and a current smoker with 0.5 packs/day who presents to the emergency department for worsening shortness of breath that began this morning.  Patient states that she was diagnosed with COVID infection 8 days ago and her symptoms were resolving until this morning when she began to experience dyspnea with exertion, fever and chills, fatigue, nonproductive cough, and wheezes.  She denies lower extremity edema, chest pain, dizziness.  She does report orthopnea, but this is chronic and unchanged.  She denied recent travel or surgery.  Of note, patient uses Coreg, she took all of her medications today.  She stated that it feels like her left jaw "is out of place" and is tender. She denied changes to vision.        Home Medications Prior to Admission medications   Medication Sig Start Date End Date Taking? Authorizing Provider  albuterol (VENTOLIN HFA) 108 (90 Base) MCG/ACT inhaler Inhale 1-2 puffs into the lungs every 6 (six) hours as needed for wheezing or shortness of breath. 06/04/22   Charlynne Pander, MD  allopurinol (ZYLOPRIM) 100 MG tablet Take 100 mg by mouth 2 (two) times daily.    [provider]  allopurinol (ZYLOPRIM) 300 MG tablet Take 1 tablet (300 mg total) by mouth daily. 12/15/12 12/15/13  Gillian Scarce, MD  ALPRAZolam Prudy Feeler) 1 MG tablet Take 0.5-1 mg by mouth 2 (two) times daily as needed. For anxiety or sleep    [provider]  amLODipine (NORVASC) 10 MG tablet Take 10 mg by mouth daily.    [provider]  azithromycin (ZITHROMAX Z-PAK) 250 MG tablet 2 po day one, then 1 daily x 4 days 06/04/22   Charlynne Pander, MD  benzonatate (TESSALON) 100 MG capsule  Take 1 capsule (100 mg total) by mouth every 8 (eight) hours. 06/04/22   Charlynne Pander, MD  Canagliflozin (INVOKANA) 300 MG TABS Take 300 mg by mouth daily. 12/15/12   Gillian Scarce, MD  COLCRYS 0.6 MG tablet  11/13/12   [provider]  DULoxetine (CYMBALTA) 60 MG capsule Take 1 capsule (60 mg total) by mouth daily. 12/15/12   Zanard, Hinton Dyer, MD  levothyroxine (SYNTHROID, LEVOTHROID) 100 MCG tablet Take 100 mcg by mouth daily.    [provider]  lidocaine (LIDODERM) 5 % Place 1 patch onto the skin daily. Remove & Discard patch within 12 hours or as directed by MD 12/20/21   Terrilee Files, MD  metFORMIN (GLUCOPHAGE-XR) 500 MG 24 hr tablet Take 1,000 mg by mouth at bedtime.     [provider]  omega-3 acid ethyl esters (LOVAZA) 1 G capsule Take 2 g by mouth 2 (two) times daily.    [provider]  pantoprazole (PROTONIX) 40 MG tablet Take 40 mg by mouth 2 (two) times daily.     [provider]  promethazine (PHENERGAN) 25 MG tablet Take 12.5 mg by mouth every 6 (six) hours as needed for nausea.    [provider]  rosuvastatin (CRESTOR) 10 MG tablet Take 1 tablet (10 mg total) by mouth daily. 12/15/12   Gillian Scarce, MD  TRAMADOL HCL ER PO Take by mouth.  [provider]  valsartan-hydrochlorothiazide (DIOVAN-HCT) 320-25 MG per tablet Take 1 tablet by mouth daily.    [provider]      Allergies    Oxycodone-acetaminophen    Review of Systems   Review of Systems  Constitutional:  Positive for chills, fatigue and fever.  HENT:  Negative for congestion.   Eyes: Negative.   Respiratory:  Positive for cough (Nonproductive) and wheezing. Negative for chest tightness.   Cardiovascular:  Negative for chest pain and leg swelling.  Gastrointestinal:  Positive for abdominal pain (Chronic).  Neurological:  Negative for dizziness and light-headedness.    Physical Exam Updated Vital Signs BP (!) 161/64 (BP Location:  Left Arm)   Pulse 62   Temp 99 F (37.2 C) (Oral)   Resp (!) 21   Ht 5\' 5"  (1.651 m)   Wt 77.6 kg   SpO2 100%   BMI 28.46 kg/m  Physical Exam Vitals and nursing note reviewed.  Constitutional:      General: She is not in acute distress.    Appearance: She is obese. She is not ill-appearing, toxic-appearing or diaphoretic.  HENT:     Head: Normocephalic.     Comments: Left temporal tenderness  Cardiovascular:     Rate and Rhythm: Regular rhythm. Bradycardia present.     Comments: No JVP No hepatojugular reflex observed  Pulmonary:     Effort: Pulmonary effort is normal. No tachypnea, accessory muscle usage or respiratory distress.     Breath sounds: Normal breath sounds.  Abdominal:     General: Bowel sounds are normal.     Palpations: Abdomen is soft.  Musculoskeletal:     Right lower leg: No tenderness. No edema.     Left lower leg: No tenderness. No edema.  Lymphadenopathy:     Cervical: No cervical adenopathy.  Skin:    General: Skin is warm and dry.  Neurological:     Mental Status: She is alert and oriented to person, place, and time.     ED Results / Procedures / Treatments   Labs (all labs ordered are listed, but only abnormal results are displayed) Labs Reviewed  BASIC METABOLIC PANEL - Abnormal; Notable for the following components:      Result Value   CO2 19 (*)    All other components within normal limits  BRAIN NATRIURETIC PEPTIDE - Abnormal; Notable for the following components:   B Natriuretic Peptide 332.5 (*)    All other components within normal limits  CBC WITH DIFFERENTIAL/PLATELET  MAGNESIUM  TROPONIN I (HIGH SENSITIVITY)  TROPONIN I (HIGH SENSITIVITY)    EKG None  Radiology DG Chest Portable 1 View  Result Date: 05/26/2023 CLINICAL DATA:  Shortness of breath. EXAM: PORTABLE CHEST 1 VIEW COMPARISON:  June 04, 2022. FINDINGS: Stable cardiomegaly. Both lungs are clear. The visualized skeletal structures are unremarkable.  IMPRESSION: No active disease. Electronically Signed   By: Lupita Raider M.D.   On: 05/26/2023 19:25    Procedures Procedures    Medications Ordered in ED Medications - No data to display  ED Course/ Medical Decision Making/ A&P Clinical Course as of 05/26/23 2158  Wed May 26, 2023  1718 Stable  (Resident) 49 YOF with one day of malaise and SOB HX of HFREF and extensive smoking/wheezing  PE: Lungs CTA, no sequelae of CHF currently Jaw pain without acute findings   AP CAP/Viral PNA- CXR CHF ex: BNP/CXR/Basic labs Bradycardia: EKG, history of similar  [CC]    Clinical  Course User Index [CC] Glyn Ade, MD                                 Medical Decision Making Patient is 71 year old female with a past medical history of HFrEF and a current smoker who presents emergency department for evaluation of shortness of breath with exertion that began this morning. She denies chest pain.  On initial evaluation, patient was tachypneic RR is, bradycardic at 38, and afebrile.  On physical exam, lungs were clear to auscultate bilaterally, cardiac exam was remarkable for bradycardia, there was no JVP, hepatojugular reflex, or lower extremity edema.  CBC was within normal limits and chest x-ray is stable, pneumonia is unlikely.  BNP is slightly elevated at 332.5; however, she does not have clinical evidence of fluid overload and chest x-ray does not show active cardiopulmonary disease, HFrEF exacerbation at this time is unlikely.  EKG shows bradycardia without AV block,ST segment elevation, or depression, troponin is 16, ACS at this time is unlikely.  Upon reevaluation, patient's heart rate increased to 62 and her respiratory rate decreased down to 21.  She reported improvement of her shortness of breath and stated that she was ready to go home.  She was discharged home with a diagnosis of symptomatic bradycardia likely due to Coreg.  She was instructed to discontinue Coreg until she is  evaluated by her primary care doctor and/or her cardiologist.  Amount and/or Complexity of Data Reviewed Independent Historian:     Details: Patient present with husband  Labs: ordered.    Details: CBC is WNL BMP is stable  BNP elevated at 332.5  Troponin is 16  Radiology: ordered and independent interpretation performed.    Details: Agree with radiology findings  ECG/medicine tests: independent interpretation performed.    Details: Sinus bradycardia, no AV block observed            Final Clinical Impression(s) / ED Diagnoses Final diagnoses:  Symptomatic bradycardia    Rx / DC Orders ED Discharge Orders     None         Faith Rogue, DO 05/26/23 2159    Glyn Ade, MD 05/26/23 2309

## 2024-01-19 IMAGING — DX DG LUMBAR SPINE COMPLETE 4+V
5 series · 5 of 5 positions shown · non-contrast
Comparison: Radiographs 11/09/2008.  MRI 06/17/2018.

CLINICAL DATA: Right-sided low back pain since falling 4 days ago.

EXAM:
LUMBAR SPINE - COMPLETE 4+ VIEW

[l-spine ap]
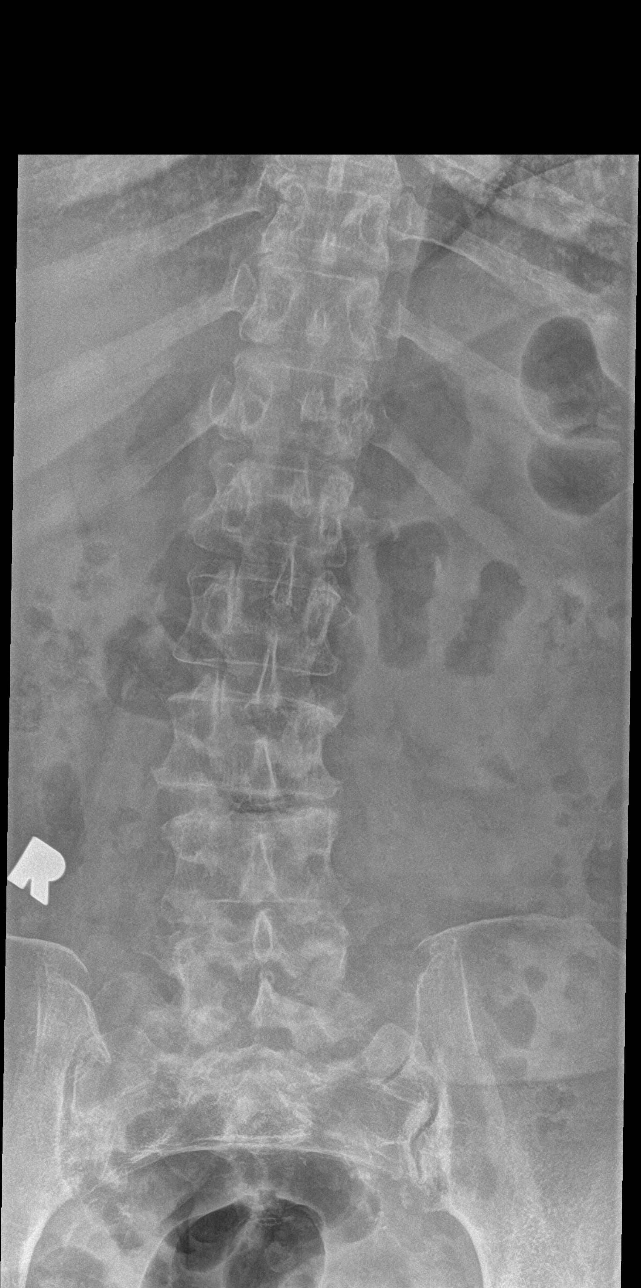

[l-spine obl (1 of 2)]
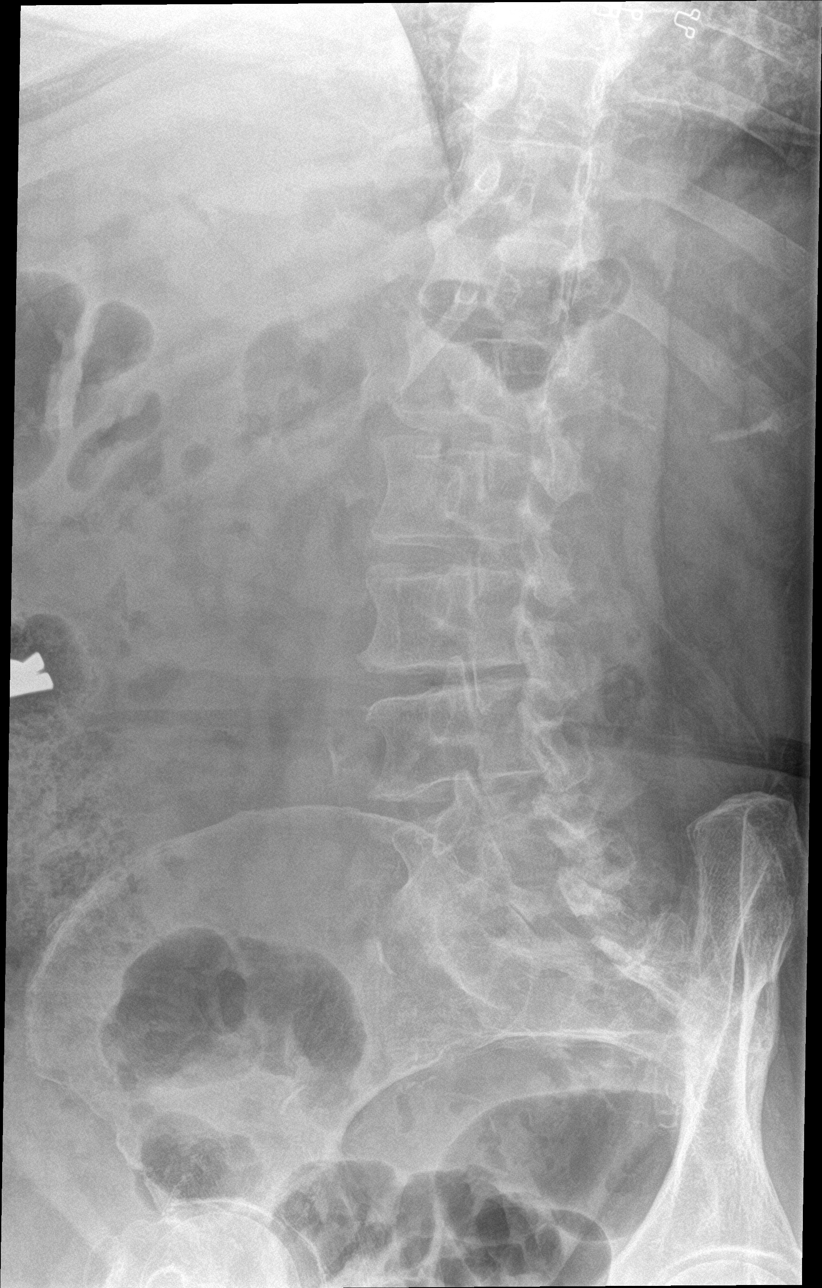

[l-spine obl (2 of 2)]
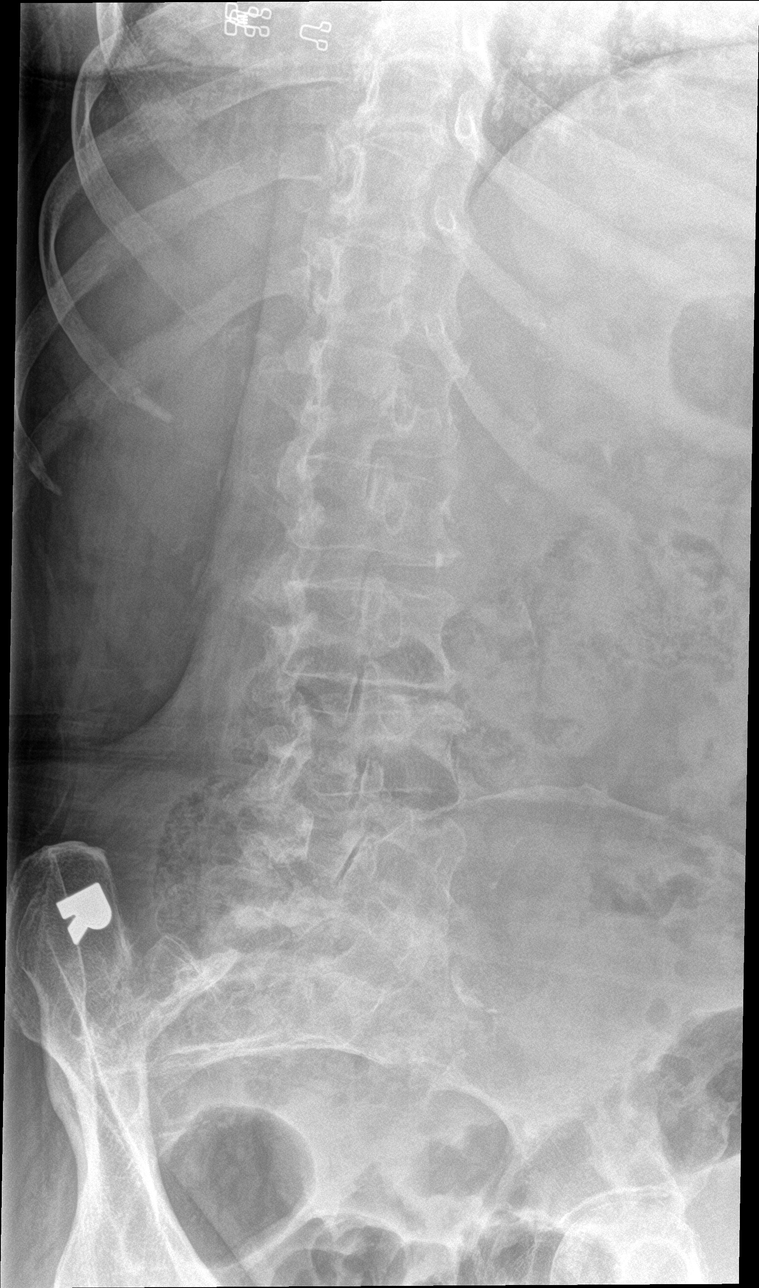

[l-spine lat]
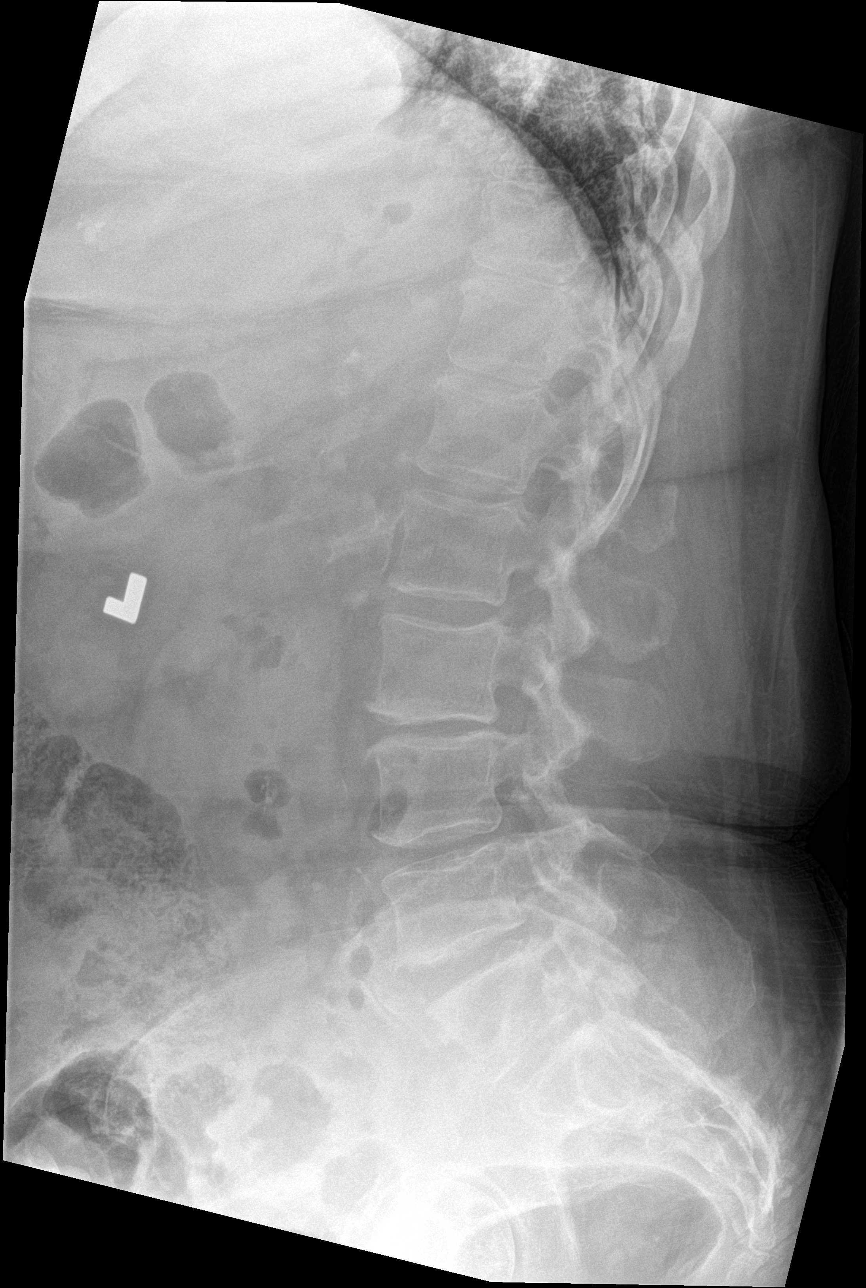

[l-spine spot]
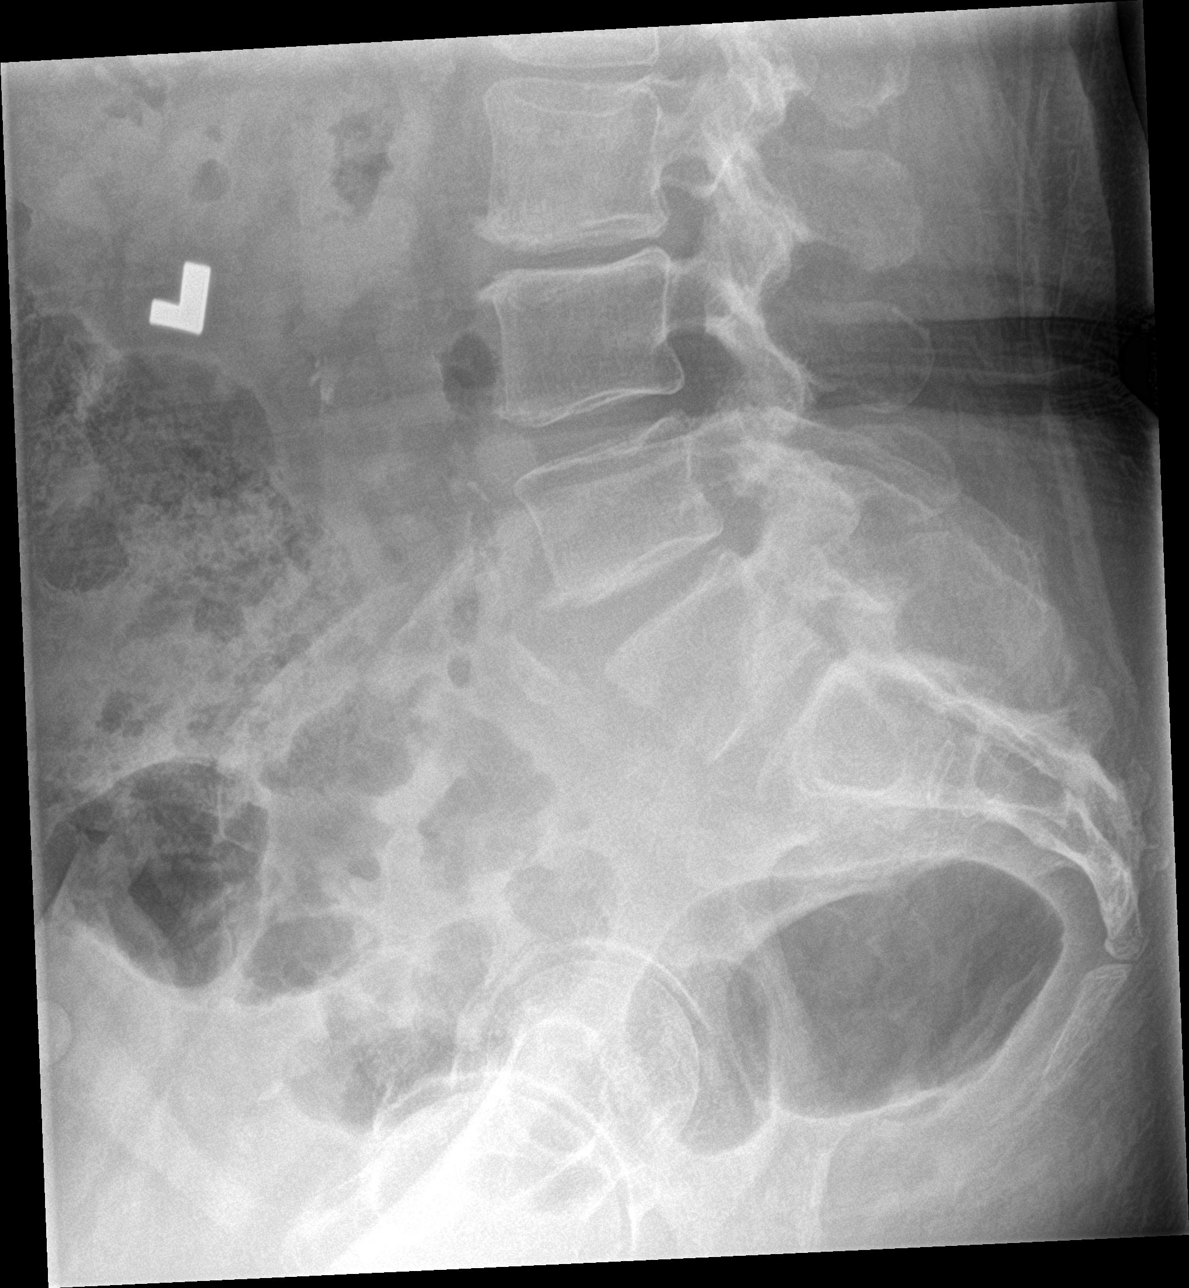

[5 of 5 positions shown; findings below may reference images not displayed]

FINDINGS: Transitional lumbosacral anatomy. In correlation with prior chest
radiographs, there are 12 pairs of ribs; hence, the transitional
segment is assigned S1 and is nearly fully lumbarized. There is a
mild convex right scoliosis. The lateral alignment is near anatomic.
There is mild multilevel spondylosis which has progressed,
especially at L3-4. No evidence of acute fracture or pars defect.
There are mild facet degenerative changes and mild aortic
atherosclerosis.
IMPRESSION: No evidence of acute lumbar spine injury.  Progressive spondylosis.
